# Patient Record
Sex: Female | Born: 2000 | Race: Black or African American | Hispanic: No | Marital: Single | Smoking: Never smoker
Health system: Southern US, Community
[De-identification: ages and names within clinical notes are randomized; demographics above are authoritative.]

## PROBLEM LIST (undated history)

## (undated) DIAGNOSIS — J45909 Unspecified asthma, uncomplicated: Secondary | ICD-10-CM

## (undated) DIAGNOSIS — B009 Herpesviral infection, unspecified: Secondary | ICD-10-CM

## (undated) DIAGNOSIS — K900 Celiac disease: Secondary | ICD-10-CM

## (undated) HISTORY — PX: LAPAROSCOPY: SHX197

---
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
## 2019-05-29 DIAGNOSIS — F419 Anxiety disorder, unspecified: Secondary | ICD-10-CM | POA: Insufficient documentation

## 2019-06-01 ENCOUNTER — Other Ambulatory Visit: Payer: Self-pay

## 2019-06-01 ENCOUNTER — Emergency Department (HOSPITAL_COMMUNITY): Payer: 59

## 2019-06-01 ENCOUNTER — Emergency Department (HOSPITAL_COMMUNITY)
Admission: EM | Admit: 2019-06-01 | Discharge: 2019-06-01 | Disposition: A | Discharge status: Home / Self Care | Payer: 59 | Attending: Emergency Medicine | Admitting: Emergency Medicine

## 2019-06-01 ENCOUNTER — Encounter (HOSPITAL_COMMUNITY): Payer: Self-pay

## 2019-06-01 DIAGNOSIS — J209 Acute bronchitis, unspecified: Secondary | ICD-10-CM | POA: Diagnosis not present

## 2019-06-01 DIAGNOSIS — J45909 Unspecified asthma, uncomplicated: Secondary | ICD-10-CM | POA: Diagnosis not present

## 2019-06-01 DIAGNOSIS — J069 Acute upper respiratory infection, unspecified: Secondary | ICD-10-CM | POA: Diagnosis not present

## 2019-06-01 DIAGNOSIS — R0602 Shortness of breath: Secondary | ICD-10-CM | POA: Diagnosis present

## 2019-06-01 DIAGNOSIS — B9789 Other viral agents as the cause of diseases classified elsewhere: Secondary | ICD-10-CM

## 2019-06-01 DIAGNOSIS — J208 Acute bronchitis due to other specified organisms: Secondary | ICD-10-CM

## 2019-06-01 DIAGNOSIS — U071 COVID-19: Secondary | ICD-10-CM | POA: Insufficient documentation

## 2019-06-01 HISTORY — DX: Unspecified asthma, uncomplicated: J45.909

## 2019-06-01 LAB — SARS CORONAVIRUS 2 (TAT 6-24 HRS): SARS Coronavirus 2: POSITIVE — AB

## 2019-06-01 NOTE — ED Triage Notes (Signed)
Patient complains of body aches, chills, SOB with asthma exacerbation since Tuesday. Reports roommates sick as well. Speaking complete sentences, alert and oriented, smoker

## 2019-06-02 ENCOUNTER — Telehealth: Payer: Self-pay | Admitting: *Deleted

## 2019-06-02 NOTE — Telephone Encounter (Signed)
Pt given results of COVID test; she states that she had an asthma attack last night; instructions given to pt: Marland Kitchen Instruct the patient to remain in self-quarantine until they meet the "Non-Test Criteria for Ending Self-Isolation". Non-Test Criteria for Ending Self-Isolation All persons with fever and respiratory symptoms should isolate themselves until ALL conditions listed below are met: - at least 10 days since symptoms onset - AND 3 consecutive days fever free without antipyretics (acetaminophen [Tylenol] or ibuprofen [Advil]) - AND improvement in respiratory symptoms . If the patient develops respiratory issues/distress, seek medical care in the Emergency Department, call 911, reports symptoms and report COVID-19 positive test. Patient Instructions . Instruct the patient to continue to utilize over-the-counter medications for fever (ibuprofen and/or Tylenol) and cough (cough medicine and/or sore throat lozenges). Wannetta Sender the patient to wear a mask around people and follow good infection prevention techniques. . Patient to should only leave home to seek medical care. . Instruct patient to send family for food, prescriptions or medicines; or use delivery service.  . If the patient must leave the home, they must wear a mask in public. . Instruct patient to limit contact with immediate family members or caregivers in the home, and use mask, social distancing, and handwashing to decrease risk to patients. o Please continue good preventive care measures, including frequent hand washing, avoid touching your face, cover coughs/sneezes with tissue or into elbow, stay out of crowds and keep a 6-foot distance from others.   . Instruct patient and family to clean hard surfaces touched by patient frequently with household cleaning products; pt informed that the  [facility] will be  notified; pt advised to notify student health [employer]; she verbalized understanding; unable to chart in result  note because no encounter created.

## 2019-06-02 NOTE — Telephone Encounter (Signed)
Robbi Garter at [facility] notified of results; she verbalized understanding; unable to chart in result note because no encounter created.

## 2019-06-02 NOTE — Telephone Encounter (Signed)
Notified by Vaughan Basta, [facility] agent that the provider was like more information regarding the pt's result; however call dropped before transfer to triage RN; attempted to contact provider, Dr Ardelle Lesches, at [phone]; left message on voice mail.

## 2019-06-02 NOTE — Telephone Encounter (Signed)
Spoke with Dr Juliane Lack; verified that the pt was seen at [facility] ED on 06/01/2019.

## 2019-06-04 NOTE — ED Provider Notes (Signed)
[place] EMERGENCY DEPARTMENT Provider Note   CSN: [id number] Arrival date & time: 06/01/19  8453     History   Chief Complaint Chief Complaint  Patient presents with  . asthma / SOB    HPI Dacy Enrico is a 18 y.o. female.     HPI   18 year old female with cough, chills, shortness of breath.  She reports several sick contacts.  No known code exposures.  Reports past history of asthma.  She felt like she has had some mild wheezing.  She not taken her temperature.  No unusual leg pain or swelling.  Past Medical History:  Diagnosis Date  . Asthma     There are no active problems to display for this patient.   History reviewed. No pertinent surgical history.   OB History   No obstetric history on file.      Home Medications    Prior to Admission medications   Not on File    Family History No family history on file.  Social History Social History   Tobacco Use  . Smoking status: Not on file  Substance Use Topics  . Alcohol use: Not on file  . Drug use: Not on file     Allergies   Patient has no known allergies.   Review of Systems Review of Systems  All systems reviewed and negative, other than as noted in HPI.  Physical Exam Updated Vital Signs BP 110/73   Pulse 69   Temp 98.4 F (36.9 C) (Oral)   Resp 16   Ht 5' 2"  (1.575 m)   Wt 66.2 kg   SpO2 99%   BMI 26.70 kg/m   Physical Exam Vitals signs and nursing note reviewed.  Constitutional:      General: She is not in acute distress.    Appearance: She is well-developed.  HENT:     Head: Normocephalic and atraumatic.  Eyes:     General:        Right eye: No discharge.        Left eye: No discharge.     Conjunctiva/sclera: Conjunctivae normal.  Neck:     Musculoskeletal: Neck supple.  Cardiovascular:     Rate and Rhythm: Normal rate and regular rhythm.     Heart sounds: Normal heart sounds. No murmur. No friction rub. No gallop.   Pulmonary:     Effort:  Pulmonary effort is normal. No respiratory distress.     Breath sounds: Normal breath sounds.  Abdominal:     General: There is no distension.     Palpations: Abdomen is soft.     Tenderness: There is no abdominal tenderness.  Musculoskeletal:        General: No tenderness.  Skin:    General: Skin is warm and dry.  Neurological:     Mental Status: She is alert.  Psychiatric:        Behavior: Behavior normal.        Thought Content: Thought content normal.      ED Treatments / Results  Labs (all labs ordered are listed, but only abnormal results are displayed) Labs Reviewed  SARS CORONAVIRUS 2 (TAT 6-24 HRS) - Abnormal; Notable for the following components:      Result Value   SARS Coronavirus 2 POSITIVE (*)    All other components within normal limits    EKG EKG Interpretation  Date/Time:  Sunday June 01 2019 09:55:46 EDT Ventricular Rate:  78 PR Interval:  144  QRS Duration: 86 QT Interval:  372 QTC Calculation: 424 R Axis:   85 Text Interpretation:  Normal sinus rhythm Early repolarization Normal ECG agree. no old comparison Confirmed by Charlesetta Shanks [phone]) on 06/01/2019 10:21:30 AM   Radiology No results found.  Procedures Procedures (including critical care time)  Medications Ordered in ED Medications - No data to display   Initial Impression / Assessment and Plan / ED Course  I have reviewed the triage vital signs and the nursing notes.  Pertinent labs & imaging results that were available during my care of the patient were reviewed by me and considered in my medical decision making (see chart for details).        18 year old female with likely viral respiratory illness.  Possibly COVID.  She overall looks very well though.  Oxygen saturations are percent on room air.  I feel she is appropriate for continued symptomatic treatment.  Return precautions were discussed.  Advised that she needs to continue to quarantine until results come back.   Of note, at time of completion of chart her COVID test had been resulted and is positive.   Ahnyla Mendel was evaluated in Emergency Department on 06/04/2019 for the symptoms described in the history of present illness. She was evaluated in the context of the global COVID-19 pandemic, which necessitated consideration that the patient might be at risk for infection with the SARS-CoV-2 virus that causes COVID-19. Institutional protocols and algorithms that pertain to the evaluation of patients at risk for COVID-19 are in a state of rapid change based on information released by regulatory bodies including the CDC and federal and state organizations. These policies and algorithms were followed during the patient's care in the ED.   Final Clinical Impressions(s) / ED Diagnoses   Final diagnoses:  Viral respiratory illness  Acute bronchitis due to COVID-19 virus    ED Discharge Orders    None       Virgel Manifold, MD 06/04/19 [phone]

## 2019-08-25 DIAGNOSIS — G43909 Migraine, unspecified, not intractable, without status migrainosus: Secondary | ICD-10-CM | POA: Insufficient documentation

## 2019-11-15 ENCOUNTER — Encounter (HOSPITAL_COMMUNITY): Payer: Self-pay | Admitting: Emergency Medicine

## 2019-11-15 ENCOUNTER — Ambulatory Visit (HOSPITAL_COMMUNITY)
Admission: EM | Admit: 2019-11-15 | Discharge: 2019-11-15 | Disposition: A | Discharge status: Home / Self Care | Payer: 59 | Attending: Urgent Care | Admitting: Urgent Care

## 2019-11-15 ENCOUNTER — Other Ambulatory Visit: Payer: Self-pay

## 2019-11-15 DIAGNOSIS — Z842 Family history of other diseases of the genitourinary system: Secondary | ICD-10-CM | POA: Diagnosis present

## 2019-11-15 DIAGNOSIS — Z3202 Encounter for pregnancy test, result negative: Secondary | ICD-10-CM | POA: Diagnosis not present

## 2019-11-15 DIAGNOSIS — N760 Acute vaginitis: Secondary | ICD-10-CM | POA: Diagnosis not present

## 2019-11-15 DIAGNOSIS — N941 Unspecified dyspareunia: Secondary | ICD-10-CM | POA: Insufficient documentation

## 2019-11-15 HISTORY — DX: Celiac disease: K90.0

## 2019-11-15 LAB — POC URINE PREG, ED: Preg Test, Ur: NEGATIVE

## 2019-11-15 LAB — POCT URINALYSIS DIP (DEVICE)
Bilirubin Urine: NEGATIVE
Glucose, UA: NEGATIVE mg/dL
Hgb urine dipstick: NEGATIVE
Ketones, ur: NEGATIVE mg/dL
Nitrite: NEGATIVE
Protein, ur: NEGATIVE mg/dL
Specific Gravity, Urine: 1.02 (ref 1.005–1.030)
Urobilinogen, UA: 0.2 mg/dL (ref 0.0–1.0)
pH: 7.5 (ref 5.0–8.0)

## 2019-11-15 LAB — POCT PREGNANCY, URINE: Preg Test, Ur: NEGATIVE

## 2019-11-15 MED ORDER — CLINDAMYCIN HCL 300 MG PO CAPS: 300.0000 mg | ORAL_CAPSULE | Freq: Two times a day (BID) | ORAL | 0 refills | Status: DC

## 2019-11-15 MED ORDER — FLUCONAZOLE 150 MG PO TABS: 150.0000 mg | ORAL_TABLET | ORAL | 0 refills | Status: DC

## 2019-11-15 NOTE — ED Triage Notes (Signed)
Lower abdominal pain, particularly after sex, patient also reports occasional pain with urination.  Pain for  1-2 weeks.  Reports having vaginal discharge

## 2019-11-15 NOTE — ED Provider Notes (Signed)
[facility]   MRN: [id number] DOB: March 25, 2001  Subjective:   Victoria Hoffman is a 19 y.o. female presenting for 1-2 week hx of dyspareunia, one episode of dysuria, malodorous clumpy vaginal discharge. Has 1 female partner, reports its monogamous relationship, low concern for STI, does not use condoms for protection.  Has a hx of BV and yeast infections.  Family hx of endometriosis.   No current facility-administered medications for this encounter.  Current Outpatient Medications:  .  fexofenadine (ALLEGRA) 60 MG tablet, Take 60 mg by mouth 2 (two) times daily., Disp: , Rfl:  .  IBUPROFEN IB PO, Take by mouth., Disp: , Rfl:  .  norethindrone-ethinyl estradiol (LOESTRIN FE) 1-20 MG-MCG tablet, Take 1 tablet by mouth daily., Disp: , Rfl:    No Known Allergies  Past Medical History:  Diagnosis Date  . Asthma   . Celiac disease      History reviewed. No pertinent surgical history.  Family History  Problem Relation Age of Onset  . Endometriosis Mother   . Hyperlipidemia Mother   . Hypertension Mother   . Celiac disease Mother     Social History   Tobacco Use  . Smoking status: Never Smoker  Substance Use Topics  . Alcohol use: Yes  . Drug use: Yes    Review of Systems  Constitutional: Negative for chills and fever.  Respiratory: Negative for shortness of breath.   Cardiovascular: Negative for chest pain.  Gastrointestinal: Positive for abdominal pain (pelvic). Negative for nausea and vomiting.  Genitourinary: Negative for dysuria, flank pain, frequency, hematuria and urgency.  Musculoskeletal: Negative for myalgias.  Skin: Negative for rash.  Neurological: Negative for dizziness and headaches.     Objective:   Vitals: BP 122/77 (BP Location: Left Arm)   Pulse 72   Temp 98.5 F (36.9 C) (Oral)   Resp 18   LMP 11/08/2019   SpO2 98%   Physical Exam Constitutional:      General: She is not in acute distress.    Appearance: Normal appearance. She is  well-developed. She is not ill-appearing, toxic-appearing or diaphoretic.  HENT:     Head: Normocephalic and atraumatic.     Nose: Nose normal.     Mouth/Throat:     Mouth: Mucous membranes are moist.     Pharynx: Oropharynx is clear.  Eyes:     General: No scleral icterus.    Extraocular Movements: Extraocular movements intact.     Pupils: Pupils are equal, round, and reactive to light.  Cardiovascular:     Rate and Rhythm: Normal rate and regular rhythm.     Pulses: Normal pulses.     Heart sounds: Normal heart sounds. No murmur. No friction rub. No gallop.   Pulmonary:     Effort: Pulmonary effort is normal. No respiratory distress.     Breath sounds: Normal breath sounds. No stridor. No wheezing, rhonchi or rales.  Abdominal:     General: Bowel sounds are normal. There is no distension.     Palpations: Abdomen is soft. There is no mass.     Tenderness: There is generalized abdominal tenderness and tenderness in the periumbilical area and suprapubic area. There is no right CVA tenderness, left CVA tenderness, guarding or rebound.  Skin:    General: Skin is warm and dry.     Findings: No rash.  Neurological:     General: No focal deficit present.     Mental Status: She is alert and oriented to person, place,  and time.  Psychiatric:        Mood and Affect: Mood normal.        Behavior: Behavior normal.        Thought Content: Thought content normal.        Judgment: Judgment normal.     Results for orders placed or performed during the hospital encounter of 11/15/19 (from the past 24 hour(s))  POCT urinalysis dip (device)     Status: Abnormal   Collection Time: 11/15/19  5:57 PM  Result Value Ref Range   Glucose, UA NEGATIVE NEGATIVE mg/dL   Bilirubin Urine NEGATIVE NEGATIVE   Ketones, ur NEGATIVE NEGATIVE mg/dL   Specific Gravity, Urine 1.020 1.005 - 1.030   Hgb urine dipstick NEGATIVE NEGATIVE   pH 7.5 5.0 - 8.0   Protein, ur NEGATIVE NEGATIVE mg/dL   Urobilinogen,  UA 0.2 0.0 - 1.0 mg/dL   Nitrite NEGATIVE NEGATIVE   Leukocytes,Ua TRACE (A) NEGATIVE  POC urine pregnancy     Status: None   Collection Time: 11/15/19  6:02 PM  Result Value Ref Range   Preg Test, Ur NEGATIVE NEGATIVE  Pregnancy, urine POC     Status: None   Collection Time: 11/15/19  6:02 PM  Result Value Ref Range   Preg Test, Ur NEGATIVE NEGATIVE    Assessment and Plan :   1. Acute vaginitis   2. Dyspareunia in female   3. Family history of endometriosis     Counseled patient and advised patient she has endometriosis and needs further evaluation with gynecologist.  Did provide her with information to establish care.  She is to start Diflucan and clindamycin to cover for yeast vaginitis and BV.  Patient feels assured about her monogamous relationship, discussed possibility of PID but patient prefers to wait on results to treat this. Counseled patient on potential for adverse effects with medications prescribed/recommended today, ER and return-to-clinic precautions discussed, patient verbalized understanding.    Jaynee Eagles, [place] 11/15/19 1806

## 2019-11-19 ENCOUNTER — Other Ambulatory Visit: Payer: Self-pay | Admitting: Nurse Practitioner

## 2019-11-19 DIAGNOSIS — R102 Pelvic and perineal pain: Secondary | ICD-10-CM

## 2019-11-20 ENCOUNTER — Ambulatory Visit
Admission: RE | Admit: 2019-11-20 | Discharge: 2019-11-20 | Disposition: A | Discharge status: Home / Self Care | Payer: 59 | Source: Ambulatory Visit | Attending: Nurse Practitioner | Admitting: Nurse Practitioner

## 2019-11-20 ENCOUNTER — Other Ambulatory Visit: Payer: Self-pay

## 2019-11-20 ENCOUNTER — Ambulatory Visit (HOSPITAL_COMMUNITY)
Admission: EM | Admit: 2019-11-20 | Discharge: 2019-11-20 | Disposition: A | Discharge status: Home / Self Care | Payer: 59 | Attending: Urgent Care | Admitting: Urgent Care

## 2019-11-20 ENCOUNTER — Telehealth (HOSPITAL_COMMUNITY): Payer: Self-pay | Admitting: *Deleted

## 2019-11-20 DIAGNOSIS — R350 Frequency of micturition: Secondary | ICD-10-CM | POA: Insufficient documentation

## 2019-11-20 DIAGNOSIS — Z7251 High risk heterosexual behavior: Secondary | ICD-10-CM | POA: Diagnosis not present

## 2019-11-20 DIAGNOSIS — Z113 Encounter for screening for infections with a predominantly sexual mode of transmission: Secondary | ICD-10-CM

## 2019-11-20 DIAGNOSIS — R899 Unspecified abnormal finding in specimens from other organs, systems and tissues: Secondary | ICD-10-CM | POA: Insufficient documentation

## 2019-11-20 DIAGNOSIS — R102 Pelvic and perineal pain: Secondary | ICD-10-CM

## 2019-11-20 LAB — POCT URINALYSIS DIP (DEVICE)
Bilirubin Urine: NEGATIVE
Glucose, UA: NEGATIVE mg/dL
Hgb urine dipstick: NEGATIVE
Ketones, ur: NEGATIVE mg/dL
Leukocytes,Ua: NEGATIVE
Nitrite: NEGATIVE
Protein, ur: NEGATIVE mg/dL
Specific Gravity, Urine: 1.02 (ref 1.005–1.030)
Urobilinogen, UA: 0.2 mg/dL (ref 0.0–1.0)
pH: 7.5 (ref 5.0–8.0)

## 2019-11-20 LAB — CERVICOVAGINAL ANCILLARY ONLY
Bacterial vaginitis: NEGATIVE
Candida vaginitis: NEGATIVE
Chlamydia: NEGATIVE
Neisseria Gonorrhea: UNDETERMINED
Trichomonas: NEGATIVE

## 2019-11-20 MED ORDER — CEFTRIAXONE SODIUM 500 MG IJ SOLR
INTRAMUSCULAR | Status: AC
  Filled 2019-11-20: qty 500

## 2019-11-20 MED ORDER — CEFTRIAXONE SODIUM 500 MG IJ SOLR: 500.0000 mg | Freq: Once | INTRAMUSCULAR | Status: DC

## 2019-11-20 MED ORDER — CEFTRIAXONE SODIUM 500 MG IJ SOLR
500.0000 mg | Freq: Once | INTRAMUSCULAR | Status: AC
  Administered 2019-11-20: 500 mg via INTRAMUSCULAR

## 2019-11-20 MED ORDER — LIDOCAINE HCL (PF) 1 % IJ SOLN
INTRAMUSCULAR | Status: AC
  Filled 2019-11-20: qty 2

## 2019-11-20 NOTE — ED Triage Notes (Signed)
Pt request retesting for STI prior to receiving Rocephin tx. Also requests a "pelvic exam". States she had pelvic pain yesterday and was evaluated by gyn yesterday including a pelvic exam. Reports taking AZO yesterday and her back pain, painful urination resolved. Had ultrasound this morning at [facility] imaging.

## 2019-11-20 NOTE — ED Provider Notes (Signed)
[facility]   MRN: [id number] DOB: 12/28/2000  Subjective:   Victoria Hoffman is a 19 y.o. female presenting for recheck on STIs.  Patient was last seen on 11/15/2019.  All results were negative but gonorrhea test was indeterminate.  I discussed this with patient by phone.  She presents now wanting to get rechecked but also would like to have urinalysis done again and recheck for her STIs.  She states that she does not have trouble right now with dysuria, vaginal discharge, fever, nausea, vomiting, abdominal pain.  She is very concerned because her boyfriend states that he has been monogamous but is very stressed out about having this indeterminate test.  Ultimately, patient wants to make sure that she gets treated.  Denies taking chronic medications.   No Known Allergies  Past Medical History:  Diagnosis Date  . Asthma   . Celiac disease      No past surgical history on file.  Family History  Problem Relation Age of Onset  . Endometriosis Mother   . Hyperlipidemia Mother   . Hypertension Mother   . Celiac disease Mother     Social History   Tobacco Use  . Smoking status: Never Smoker  Substance Use Topics  . Alcohol use: Yes  . Drug use: Yes    ROS   Objective:   Vitals: BP 123/78 (BP Location: Right Arm)   Pulse 79   Temp 98.7 F (37.1 C) (Oral)   Resp 20   LMP 11/08/2019   SpO2 97%   Physical Exam Constitutional:      General: She is not in acute distress.    Appearance: Normal appearance. She is well-developed and normal weight. She is not ill-appearing, toxic-appearing or diaphoretic.  HENT:     Head: Normocephalic and atraumatic.     Right Ear: External ear normal.     Left Ear: External ear normal.     Nose: Nose normal.     Mouth/Throat:     Mouth: Mucous membranes are moist.     Pharynx: Oropharynx is clear.  Eyes:     General: No scleral icterus.    Extraocular Movements: Extraocular movements intact.     Pupils: Pupils are equal,  round, and reactive to light.  Cardiovascular:     Rate and Rhythm: Normal rate and regular rhythm.     Heart sounds: Normal heart sounds. No murmur. No friction rub. No gallop.   Pulmonary:     Effort: Pulmonary effort is normal. No respiratory distress.     Breath sounds: Normal breath sounds. No stridor. No wheezing, rhonchi or rales.  Abdominal:     General: Bowel sounds are normal. There is no distension.     Palpations: Abdomen is soft. There is no mass.     Tenderness: There is no abdominal tenderness. There is no right CVA tenderness, left CVA tenderness, guarding or rebound.  Skin:    General: Skin is warm and dry.     Coloration: Skin is not pale.     Findings: No rash.  Neurological:     General: No focal deficit present.     Mental Status: She is alert and oriented to person, place, and time.  Psychiatric:        Mood and Affect: Mood normal.        Behavior: Behavior normal.        Thought Content: Thought content normal.        Judgment: Judgment normal.  Results for orders placed or performed during the hospital encounter of 11/20/19 (from the past 24 hour(s))  POCT urinalysis dip (device)     Status: None   Collection Time: 11/20/19  3:08 PM  Result Value Ref Range   Glucose, UA NEGATIVE NEGATIVE mg/dL   Bilirubin Urine NEGATIVE NEGATIVE   Ketones, ur NEGATIVE NEGATIVE mg/dL   Specific Gravity, Urine 1.020 1.005 - 1.030   Hgb urine dipstick NEGATIVE NEGATIVE   pH 7.5 5.0 - 8.0   Protein, ur NEGATIVE NEGATIVE mg/dL   Urobilinogen, UA 0.2 0.0 - 1.0 mg/dL   Nitrite NEGATIVE NEGATIVE   Leukocytes,Ua NEGATIVE NEGATIVE    Assessment and Plan :   1. Abnormal laboratory test result   2. Unprotected sex   3. Urinary frequency     Had extensive discussion with patient about her indeterminate gonorrhea result.  Ultimately, patient decided on treating empirically with IM ceftriaxone in clinic.  I reassured her that the urinalysis was normal and she does not  have urinary tract infection.  Patient requested to repeat testing for her cervical swab.  I informed her that we did let her know with these results are through my chart. Counseled patient on potential for adverse effects with medications prescribed/recommended today, ER and return-to-clinic precautions discussed, patient verbalized understanding.    Jaynee Eagles, [place] 11/20/19 1858

## 2019-11-20 NOTE — Discharge Instructions (Signed)
Avoid all forms of sexual intercourse (oral, vaginal, anal) for the next 7 days to avoid spreading/reinfecting. Return if symptoms worsen/do not resolve, you develop fever, abdominal pain, blood in your urine, or are re-exposed to an STI.

## 2019-11-20 NOTE — Telephone Encounter (Signed)
Jaynee Eagles PA contacted patient directly. She has an indeterminant gonorrhea result, all other tests were negative. Emphasized that we should go ahead and treat for gonorrhea with 547m IM ceftriaxone x 1 dose. Patient is agreeable to this and will come in for nurse visit.

## 2019-11-21 LAB — URINE CULTURE: Culture: NO GROWTH

## 2019-11-22 LAB — CERVICOVAGINAL ANCILLARY ONLY
Bacterial vaginitis: NEGATIVE
Candida vaginitis: NEGATIVE
Chlamydia: NEGATIVE
Neisseria Gonorrhea: NEGATIVE
Trichomonas: NEGATIVE

## 2020-08-19 ENCOUNTER — Encounter (HOSPITAL_COMMUNITY): Payer: Self-pay

## 2020-08-19 ENCOUNTER — Ambulatory Visit (HOSPITAL_COMMUNITY)
Admission: EM | Admit: 2020-08-19 | Discharge: 2020-08-19 | Disposition: A | Discharge status: Home / Self Care | Payer: 59 | Attending: Family Medicine | Admitting: Family Medicine

## 2020-08-19 ENCOUNTER — Other Ambulatory Visit: Payer: Self-pay

## 2020-08-19 DIAGNOSIS — Z202 Contact with and (suspected) exposure to infections with a predominantly sexual mode of transmission: Secondary | ICD-10-CM | POA: Diagnosis not present

## 2020-08-19 NOTE — Discharge Instructions (Signed)
Please follow up if you notice any symptoms.

## 2020-08-19 NOTE — ED Triage Notes (Signed)
Pt reports of exposure to a partner with an STD. Pt states she is not having sxs at the moment. Pt states she was exposed last week. Pt is requesting to have tx. Pt states she wants a vaginal check to see if there are any bumps in the area.

## 2020-08-19 NOTE — ED Provider Notes (Signed)
[facility]    CSN: [id number] Arrival date & time: 08/19/20  1847      History   Chief Complaint Chief Complaint  Patient presents with   Exposure to STD    HPI Victoria Hoffman is a 19 y.o. female.   She is presenting with concern for being exposed to herpes.  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  She denies any outbreaks or any vesicles that she is seen.  She reports being tested negative for gonorrhea chlamydia 2 weeks ago.  Denies any vaginal discharge or dysuria.  HPI  Past Medical History:  Diagnosis Date   Asthma    Celiac disease     There are no problems to display for this patient.   History reviewed. No pertinent surgical history.  OB History   No obstetric history on file.      Home Medications    Prior to Admission medications   Medication Sig Start Date End Date Taking? Authorizing Provider  clindamycin (CLEOCIN) 300 MG capsule Take 1 capsule (300 mg total) by mouth in the morning and at bedtime. 11/15/19   Wallis Bamberg, PA-C  famotidine (PEPCID) 10 MG tablet Take by mouth.    [provider]  fexofenadine (ALLEGRA) 60 MG tablet Take 60 mg by mouth 2 (two) times daily.    [provider]  fluconazole (DIFLUCAN) 150 MG tablet Take 1 tablet (150 mg total) by mouth once a week. 11/15/19   Wallis Bamberg, PA-C  IBUPROFEN IB PO Take by mouth.    [provider]  norethindrone-ethinyl estradiol (LOESTRIN FE) 1-20 MG-MCG tablet Take 1 tablet by mouth daily.    [provider]    Family History Family History  Problem Relation Age of Onset   Endometriosis Mother    Hyperlipidemia Mother    Hypertension Mother    Celiac disease Mother     Social History Social History   Tobacco Use   Smoking status: Never Smoker   Smokeless tobacco: Never Used  Substance Use Topics   Alcohol use: Yes   Drug use: Yes     Allergies   Patient has no known allergies.   Review  of Systems Review of Systems  See HPI  Physical Exam Triage Vital Signs ED Triage Vitals  Enc Vitals Group     BP 08/19/20 1950 130/79     Pulse Rate 08/19/20 1950 89     Resp 08/19/20 1950 18     Temp 08/19/20 1950 99 F (37.2 C)     Temp Source 08/19/20 1950 Oral     SpO2 08/19/20 1950 100 %     Weight --      Height --      Head Circumference --      Peak Flow --      Pain Score 08/19/20 1947 0     Pain Loc --      Pain Edu? --      Excl. in GC? --    No data found.  Updated Vital Signs BP 130/79 (BP Location: Right Arm)    Pulse 89    Temp 99 F (37.2 C) (Oral)    Resp 18    LMP 08/15/2020 (Exact Date)    SpO2 100%   Visual Acuity Right Eye Distance:   Left Eye Distance:   Bilateral Distance:    Right Eye Near:   Left Eye Near:    Bilateral Near:  Physical Exam Gen: NAD, alert, cooperative with exam, well-appearing ENT: normal lips, normal nasal mucosa,  Eye: normal EOM, normal conjunctiva and lids GU: Normal-appearing external vaginal genitalia. Skin: no rashes, no areas of induration  Neuro: normal tone, normal sensation to touch Psych:  normal insight, alert and oriented    UC Treatments / Results  Labs (all labs ordered are listed, but only abnormal results are displayed) Labs Reviewed - No data to display  EKG   Radiology No results found.  Procedures Procedures (including critical care time)  Medications Ordered in UC Medications - No data to display  Initial Impression / Assessment and Plan / UC Course  I have reviewed the triage vital signs and the nursing notes.  Pertinent labs & imaging results that were available during my care of the patient were reviewed by me and considered in my medical decision making (see chart for details).     Victoria Hoffman is a 19 year old female that is presenting with concern for exposure to HSV-2.  No clinical suggestion on exam.  Counseled supportive care.  Given occasional follow-up.  Final Clinical  Impressions(s) / UC Diagnoses   Final diagnoses:  Possible exposure to STD     Discharge Instructions     Please follow up if you notice any symptoms.      ED Prescriptions    None     PDMP not reviewed this encounter.   Myra Rude, MD 08/19/20 2137

## 2021-05-10 ENCOUNTER — Emergency Department (HOSPITAL_COMMUNITY)
Admission: EM | Admit: 2021-05-10 | Discharge: 2021-05-11 | Disposition: A | Discharge status: Home / Self Care | Payer: BC Managed Care – PPO | Attending: Emergency Medicine | Admitting: Emergency Medicine

## 2021-05-10 ENCOUNTER — Other Ambulatory Visit: Payer: Self-pay

## 2021-05-10 DIAGNOSIS — Z20822 Contact with and (suspected) exposure to covid-19: Secondary | ICD-10-CM | POA: Insufficient documentation

## 2021-05-10 DIAGNOSIS — J3489 Other specified disorders of nose and nasal sinuses: Secondary | ICD-10-CM | POA: Diagnosis not present

## 2021-05-10 DIAGNOSIS — J45909 Unspecified asthma, uncomplicated: Secondary | ICD-10-CM | POA: Insufficient documentation

## 2021-05-10 DIAGNOSIS — J00 Acute nasopharyngitis [common cold]: Secondary | ICD-10-CM | POA: Diagnosis not present

## 2021-05-10 DIAGNOSIS — R531 Weakness: Secondary | ICD-10-CM | POA: Diagnosis not present

## 2021-05-10 DIAGNOSIS — R509 Fever, unspecified: Secondary | ICD-10-CM | POA: Diagnosis present

## 2021-05-10 LAB — COMPREHENSIVE METABOLIC PANEL
ALT: 16 U/L (ref 0–44)
AST: 22 U/L (ref 15–41)
Albumin: 4.2 g/dL (ref 3.5–5.0)
Alkaline Phosphatase: 43 U/L (ref 38–126)
Anion gap: 8 (ref 5–15)
BUN: 9 mg/dL (ref 6–20)
CO2: 24 mmol/L (ref 22–32)
Calcium: 8.8 mg/dL — ABNORMAL LOW (ref 8.9–10.3)
Chloride: 104 mmol/L (ref 98–111)
Creatinine, Ser: 0.84 mg/dL (ref 0.44–1.00)
GFR, Estimated: >60 mL/min (ref >60)
Glucose, Bld: 98 mg/dL (ref 70–99)
Potassium: 4 mmol/L (ref 3.5–5.1)
Sodium: 136 mmol/L (ref 135–145)
Total Bilirubin: 0.7 mg/dL (ref 0.3–1.2)
Total Protein: 7.8 g/dL (ref 6.5–8.1)

## 2021-05-10 LAB — CBC WITH DIFFERENTIAL/PLATELET
Abs Immature Granulocytes: 0.01 10*3/uL (ref 0.00–0.07)
Basophils Absolute: 0 10*3/uL (ref 0.0–0.1)
Basophils Relative: 1 %
Eosinophils Absolute: 0 10*3/uL (ref 0.0–0.5)
Eosinophils Relative: 0 %
HCT: 39.3 % (ref 36.0–46.0)
Hemoglobin: 12.8 g/dL (ref 12.0–15.0)
Immature Granulocytes: 0 %
Lymphocytes Relative: 28 %
Lymphs Abs: 2 10*3/uL (ref 0.7–4.0)
MCH: 28.7 pg (ref 26.0–34.0)
MCHC: 32.6 g/dL (ref 30.0–36.0)
MCV: 88.1 fL (ref 80.0–100.0)
Monocytes Absolute: 0.6 10*3/uL (ref 0.1–1.0)
Monocytes Relative: 8 %
Neutro Abs: 4.5 10*3/uL (ref 1.7–7.7)
Neutrophils Relative %: 63 %
Platelets: 307 10*3/uL (ref 150–400)
RBC: 4.46 MIL/uL (ref 3.87–5.11)
RDW: 13.2 % (ref 11.5–15.5)
WBC: 7.2 10*3/uL (ref 4.0–10.5)
nRBC: 0 % (ref 0.0–0.2)

## 2021-05-10 LAB — I-STAT BETA HCG BLOOD, ED (MC, WL, AP ONLY): I-stat hCG, quantitative: <5 m[IU]/mL (ref <5)

## 2021-05-10 LAB — RESP PANEL BY RT-PCR (FLU A&B, COVID) ARPGX2
Influenza A by PCR: NEGATIVE
Influenza B by PCR: NEGATIVE
SARS Coronavirus 2 by RT PCR: NEGATIVE

## 2021-05-10 LAB — LACTIC ACID, PLASMA: Lactic Acid, Venous: 0.9 mmol/L (ref 0.5–1.9)

## 2021-05-10 NOTE — ED Triage Notes (Signed)
Pt complains of fever and weakness since Friday.

## 2021-05-10 NOTE — ED Notes (Signed)
2nd lactic acid have not been collected

## 2021-05-11 NOTE — ED Provider Notes (Signed)
[facility] DEPT Provider Note  CSN: [id number] Arrival date & time: 05/10/21 2250  Chief Complaint(s) Fever and Weakness  HPI Victoria Hoffman is a 20 y.o. female    Fever Max temp prior to arrival:  25 Temp source:  Oral Severity:  Moderate Onset quality:  Gradual Duration:  5 days Timing:  Intermittent Progression:  Waxing and waning Chronicity:  New Relieved by:  Acetaminophen Associated symptoms: chills, congestion, headaches (sinus) and rhinorrhea   Associated symptoms: no cough, no dysuria, no myalgias, no nausea, no sore throat and no vomiting   Risk factors: sick contacts   Weakness Associated symptoms: fever and headaches (sinus)   Associated symptoms: no cough, no dysuria, no myalgias, no nausea and no vomiting    Past Medical History Past Medical History:  Diagnosis Date   Asthma    Celiac disease    There are no problems to display for this patient.  Home Medication(s) Prior to Admission medications   Medication Sig Start Date End Date Taking? Authorizing Provider  clindamycin (CLEOCIN) 300 MG capsule Take 1 capsule (300 mg total) by mouth in the morning and at bedtime. 11/15/19   Jaynee Eagles, PA-C  famotidine (PEPCID) 10 MG tablet Take by mouth.    [provider]  fexofenadine (ALLEGRA) 60 MG tablet Take 60 mg by mouth 2 (two) times daily.    [provider]  fluconazole (DIFLUCAN) 150 MG tablet Take 1 tablet (150 mg total) by mouth once a week. 11/15/19   Jaynee Eagles, PA-C  IBUPROFEN IB PO Take by mouth.    [provider]  norethindrone-ethinyl estradiol (LOESTRIN FE) 1-20 MG-MCG tablet Take 1 tablet by mouth daily.    [provider]                                                                                                                                    Past Surgical History No past surgical history on file. Family History Family History  Problem Relation Age of Onset    Endometriosis Mother    Hyperlipidemia Mother    Hypertension Mother    Celiac disease Mother     Social History Social History   Tobacco Use   Smoking status: Never   Smokeless tobacco: Never  Substance Use Topics   Alcohol use: Yes   Drug use: Yes   Allergies Patient has no known allergies.  Review of Systems Review of Systems  Constitutional:  Positive for chills and fever.  HENT:  Positive for congestion and rhinorrhea. Negative for sore throat.   Respiratory:  Negative for cough.   Gastrointestinal:  Negative for nausea and vomiting.  Genitourinary:  Negative for dysuria.  Musculoskeletal:  Negative for myalgias.  Neurological:  Positive for weakness and headaches (sinus).  All other systems are reviewed and are negative for acute change except as noted in the HPI  Physical Exam Vital Signs  I  have reviewed the triage vital signs BP 114/67   Pulse 93   Temp 99.8 F (37.7 C) (Oral)   Resp 18   Ht 5' 2"  (1.575 m)   Wt 68 kg   SpO2 100%   BMI 27.44 kg/m    Physical Exam Vitals reviewed.  Constitutional:      General: She is not in acute distress.    Appearance: She is well-developed. She is not diaphoretic.  HENT:     Head: Normocephalic and atraumatic.     Right Ear: Tympanic membrane normal.     Left Ear: Tympanic membrane normal.     Nose: Congestion and rhinorrhea present.     Right Sinus: Frontal sinus tenderness present.     Left Sinus: Frontal sinus tenderness present.     Mouth/Throat:     Comments: Post nasal drip with cobblestoning Eyes:     General: No scleral icterus.       Right eye: No discharge.        Left eye: No discharge.     Conjunctiva/sclera: Conjunctivae normal.     Pupils: Pupils are equal, round, and reactive to light.  Cardiovascular:     Rate and Rhythm: Normal rate and regular rhythm.     Heart sounds: No murmur heard.   No friction rub. No gallop.  Pulmonary:     Effort: Pulmonary effort is normal. No respiratory  distress.     Breath sounds: Normal breath sounds. No stridor. No rales.  Abdominal:     General: There is no distension.     Palpations: Abdomen is soft.     Tenderness: There is no abdominal tenderness.  Musculoskeletal:        General: No tenderness.     Cervical back: Normal range of motion and neck supple.  Skin:    General: Skin is warm and dry.     Findings: No erythema or rash.  Neurological:     Mental Status: She is alert and oriented to person, place, and time.    ED Results and Treatments Labs (all labs ordered are listed, but only abnormal results are displayed) Labs Reviewed  COMPREHENSIVE METABOLIC PANEL - Abnormal; Notable for the following components:      Result Value   Calcium 8.8 (*)    All other components within normal limits  RESP PANEL BY RT-PCR (FLU A&B, COVID) ARPGX2  CBC WITH DIFFERENTIAL/PLATELET  LACTIC ACID, PLASMA  LACTIC ACID, PLASMA  I-STAT BETA HCG BLOOD, ED ([facility], [facility], [facility] ONLY)                                                                                                                         EKG  EKG Interpretation  Date/Time:    Ventricular Rate:    PR Interval:    QRS Duration:   QT Interval:    QTC Calculation:   R Axis:     Text Interpretation:  Radiology No results found.  Pertinent labs & imaging results that were available during my care of the patient were reviewed by me and considered in my medical decision making (see MDM for details).  Medications Ordered in ED Medications - No data to display                                                                                                                                   Procedures Procedures  (including critical care time)  Medical Decision Making / ED Course I have reviewed the nursing notes for this encounter and the patient's prior records (if available in EHR or on provided paperwork).  Victoria Hoffman was evaluated in Emergency Department on 05/11/2021  for the symptoms described in the history of present illness. She was evaluated in the context of the global COVID-19 pandemic, which necessitated consideration that the patient might be at risk for infection with the SARS-CoV-2 virus that causes COVID-19. Institutional protocols and algorithms that pertain to the evaluation of patients at risk for COVID-19 are in a state of rapid change based on information released by regulatory bodies including the CDC and federal and state organizations. These policies and algorithms were followed during the patient's care in the ED.     Patient presents with viral symptoms for 5 days. Adequate oral hydration. Rest of history as above.  Patient appears well. No signs of toxicity, patient is interactive. No hypoxia, tachypnea or other signs of respiratory distress. No sign of clinical dehydration. Lung exam clear. Rest of exam as above.  Screening labs from triage reassuring.  Most consistent with viral illness   No evidence suggestive of pharyngitis, AOM, PNA, or meningitis.  Chest x-ray not indicated at this time.  Discussed symptomatic treatment with the patient and they will follow closely with their PCP.   Pertinent labs & imaging results that were available during my care of the patient were reviewed by me and considered in my medical decision making:    Final Clinical Impression(s) / ED Diagnoses Final diagnoses:  Acute nasopharyngitis   The patient appears reasonably screened and/or stabilized for discharge and I doubt any other medical condition or other [facility] requiring further screening, evaluation, or treatment in the ED at this time prior to discharge. Safe for discharge with strict return precautions.  Disposition: Discharge  Condition: Good  I have discussed the results, Dx and Tx plan with the patient/family who expressed understanding and agree(s) with the plan. Discharge instructions discussed at length. The patient/family was given  strict return precautions who verbalized understanding of the instructions. No further questions at time of discharge.    ED Discharge Orders     None       Follow Up: Primary care provider  Call  as needed    This chart was dictated using voice recognition software.  Despite best efforts to proofread,  errors can occur which can change the  documentation meaning.    Fatima Blank, MD 05/11/21 [phone]

## 2021-05-11 NOTE — Discharge Instructions (Addendum)
You may take over-the-counter medicine for symptomatic relief, such as Tylenol, Motrin, TheraFlu, Alka seltzer , black elderberry, etc. Please limit acetaminophen (Tylenol) to 4000 mg and Ibuprofen (Motrin, Advil, etc.) to 2400 mg for a 24hr period. Please note that other over-the-counter medicine may contain acetaminophen or ibuprofen as a component of their ingredients.   

## 2021-07-31 ENCOUNTER — Emergency Department (HOSPITAL_COMMUNITY): Payer: BC Managed Care – PPO

## 2021-07-31 ENCOUNTER — Encounter (HOSPITAL_COMMUNITY): Payer: Self-pay | Admitting: Pharmacy Technician

## 2021-07-31 ENCOUNTER — Other Ambulatory Visit: Payer: Self-pay

## 2021-07-31 ENCOUNTER — Emergency Department (HOSPITAL_COMMUNITY)
Admission: EM | Admit: 2021-07-31 | Discharge: 2021-07-31 | Disposition: A | Discharge status: Home / Self Care | Payer: BC Managed Care – PPO | Attending: Emergency Medicine | Admitting: Emergency Medicine

## 2021-07-31 DIAGNOSIS — N9489 Other specified conditions associated with female genital organs and menstrual cycle: Secondary | ICD-10-CM | POA: Insufficient documentation

## 2021-07-31 DIAGNOSIS — R002 Palpitations: Secondary | ICD-10-CM | POA: Insufficient documentation

## 2021-07-31 DIAGNOSIS — J45909 Unspecified asthma, uncomplicated: Secondary | ICD-10-CM | POA: Diagnosis not present

## 2021-07-31 LAB — CBC WITH DIFFERENTIAL/PLATELET
Abs Immature Granulocytes: 0.01 10*3/uL (ref 0.00–0.07)
Basophils Absolute: 0.1 10*3/uL (ref 0.0–0.1)
Basophils Relative: 1 %
Eosinophils Absolute: 0.1 10*3/uL (ref 0.0–0.5)
Eosinophils Relative: 1 %
HCT: 40.8 % (ref 36.0–46.0)
Hemoglobin: 13.5 g/dL (ref 12.0–15.0)
Immature Granulocytes: 0 %
Lymphocytes Relative: 30 %
Lymphs Abs: 2 10*3/uL (ref 0.7–4.0)
MCH: 29.2 pg (ref 26.0–34.0)
MCHC: 33.1 g/dL (ref 30.0–36.0)
MCV: 88.1 fL (ref 80.0–100.0)
Monocytes Absolute: 0.6 10*3/uL (ref 0.1–1.0)
Monocytes Relative: 10 %
Neutro Abs: 3.9 10*3/uL (ref 1.7–7.7)
Neutrophils Relative %: 58 %
Platelets: 306 10*3/uL (ref 150–400)
RBC: 4.63 MIL/uL (ref 3.87–5.11)
RDW: 14.1 % (ref 11.5–15.5)
WBC: 6.7 10*3/uL (ref 4.0–10.5)
nRBC: 0 % (ref 0.0–0.2)

## 2021-07-31 LAB — BASIC METABOLIC PANEL
Anion gap: 8 (ref 5–15)
BUN: 7 mg/dL (ref 6–20)
CO2: 23 mmol/L (ref 22–32)
Calcium: 9 mg/dL (ref 8.9–10.3)
Chloride: 104 mmol/L (ref 98–111)
Creatinine, Ser: 0.78 mg/dL (ref 0.44–1.00)
GFR, Estimated: >60 mL/min (ref >60)
Glucose, Bld: 90 mg/dL (ref 70–99)
Potassium: 3.8 mmol/L (ref 3.5–5.1)
Sodium: 135 mmol/L (ref 135–145)

## 2021-07-31 LAB — HIV ANTIBODY (ROUTINE TESTING W REFLEX): HIV Screen 4th Generation wRfx: NONREACTIVE

## 2021-07-31 LAB — I-STAT BETA HCG BLOOD, ED (MC, WL, AP ONLY): I-stat hCG, quantitative: <5 m[IU]/mL (ref <5)

## 2021-07-31 MED ORDER — LORAZEPAM 1 MG PO TABS
1.0000 mg | ORAL_TABLET | Freq: Once | ORAL | Status: AC
  Administered 2021-07-31: 22:00:00 1 mg via ORAL
  Filled 2021-07-31: qty 1

## 2021-07-31 MED ORDER — LORAZEPAM 1 MG PO TABS: 1.0000 mg | ORAL_TABLET | Freq: Every day | ORAL | 0 refills | Status: DC | PRN

## 2021-07-31 NOTE — ED Provider Notes (Signed)
Emergency Medicine Provider Triage Evaluation Note  Victoria Hoffman , a 20 y.o. female  was evaluated in triage.  Pt complains of palpitations.  Having worsening anxiety.  Palpitations intermittently during the last 2 days.  Had exposure to HIV.  Was seen by PCP, started on PEP within 12 hours of exposure.  She is requesting additional HIV test here today.  She does feel anxious.  She also feels dehydrated however denies any decreased p.o. intake, increased urination, diarrhea.  She denies any pain  Review of Systems  Positive: Palpitations, requesting HIV test Negative: Chest pain, shortness of breath, abd pain  Physical Exam  BP 134/84 (BP Location: Right Arm)   Pulse (!) 108   Temp 98.3 F (36.8 C)   Resp 18   SpO2 100%  Gen:   Awake, no distress   Resp:  Normal effort  MSK:   Moves extremities without difficulty  Other:    Medical Decision Making  Medically screening exam initiated at 6:17 PM.  Appropriate orders placed.  Victoria Hoffman was informed that the remainder of the evaluation will be completed by another provider, this initial triage assessment does not replace that evaluation, and the importance of remaining in the ED until their evaluation is complete.  Palpitations, requesting HIV testing   Victoria Elm, PA-C 07/31/21 1819    Victoria Dusky, MD 07/31/21 2109

## 2021-07-31 NOTE — Discharge Instructions (Addendum)
I am prescribing a medication called Ativan.  If you are experiencing severe anxiety that you cannot control you can take this medication.  Please only take it as prescribed.  Do not drive a car after taking it.  Do not mix with alcohol.  Please follow-up with your regular doctor regarding your symptoms.  If you develop any new or worsening symptoms please come back to the emergency department.

## 2021-07-31 NOTE — ED Provider Notes (Signed)
[facility] EMERGENCY DEPARTMENT Provider Note   CSN: [id number] Arrival date & time: 07/31/21  1723     History Chief Complaint  Patient presents with   Palpitations    Victoria Hoffman is a 20 y.o. female.  HPI Patient is a 19 year old female with a history of asthma, celiac disease, as well as anxiety who presents to the emergency department due to palpitations.  Patient states that her symptoms began yesterday while at work.  She states that she worked a long shift the day before and also smoked marijuana the night before.  She states that she felt as if she was having worsening anxiety and began having intermittent palpitations during the day as well as last night.  Reports associated chest tightness.  She also notes that she had HIV exposure last month.  She was seen by her PCP immediately after this and started on PEP within 12 hours of exposure.  She had a negative HIV test at that time.  She states that her partner has an undetectable viral load and she states that they were also using protection during this encounter.  Denies any fevers, chills, nausea, vomiting, diarrhea.    Past Medical History:  Diagnosis Date   Asthma    Celiac disease     There are no problems to display for this patient.   History reviewed. No pertinent surgical history.   OB History   No obstetric history on file.     Family History  Problem Relation Age of Onset   Endometriosis Mother    Hyperlipidemia Mother    Hypertension Mother    Celiac disease Mother     Social History   Tobacco Use   Smoking status: Never   Smokeless tobacco: Never  Substance Use Topics   Alcohol use: Yes   Drug use: Yes    Home Medications Prior to Admission medications   Medication Sig Start Date End Date Taking? Authorizing Provider  LORazepam (ATIVAN) 1 MG tablet Take 1 tablet (1 mg total) by mouth daily as needed for anxiety. 07/31/21  Yes Rayna Sexton, PA-C  clindamycin  (CLEOCIN) 300 MG capsule Take 1 capsule (300 mg total) by mouth in the morning and at bedtime. 11/15/19   Jaynee Eagles, PA-C  famotidine (PEPCID) 10 MG tablet Take by mouth.    [provider]  fexofenadine (ALLEGRA) 60 MG tablet Take 60 mg by mouth 2 (two) times daily.    [provider]  fluconazole (DIFLUCAN) 150 MG tablet Take 1 tablet (150 mg total) by mouth once a week. 11/15/19   Jaynee Eagles, PA-C  IBUPROFEN IB PO Take by mouth.    [provider]  norethindrone-ethinyl estradiol (LOESTRIN FE) 1-20 MG-MCG tablet Take 1 tablet by mouth daily.    [provider]    Allergies    Patient has no known allergies.  Review of Systems   Review of Systems  All other systems reviewed and are negative. Ten systems reviewed and are negative for acute change, except as noted in the HPI.   Physical Exam Updated Vital Signs BP 124/80 (BP Location: Right Arm)   Pulse 86   Temp 98.4 F (36.9 C) (Oral)   Resp 16   LMP 06/30/2021 (Within Days)   SpO2 100%   Physical Exam Vitals and nursing note reviewed.  Constitutional:      General: She is not in acute distress.    Appearance: Normal appearance. She is not ill-appearing, toxic-appearing or diaphoretic.  HENT:     Head: Normocephalic and atraumatic.     Right Ear: External ear normal.     Left Ear: External ear normal.     Nose: Nose normal.     Mouth/Throat:     Mouth: Mucous membranes are moist.     Pharynx: Oropharynx is clear. No oropharyngeal exudate or posterior oropharyngeal erythema.  Eyes:     Extraocular Movements: Extraocular movements intact.  Cardiovascular:     Rate and Rhythm: Normal rate and regular rhythm.     Pulses: Normal pulses.     Heart sounds: Normal heart sounds. No murmur heard.   No friction rub. No gallop.  Pulmonary:     Effort: Pulmonary effort is normal. No respiratory distress.     Breath sounds: Normal breath sounds. No stridor. No wheezing, rhonchi or rales.   Abdominal:     General: Abdomen is flat.     Palpations: Abdomen is soft.     Tenderness: There is no abdominal tenderness.     Comments: Abdomen is flat, soft, and nontender  Musculoskeletal:        General: Normal range of motion.     Cervical back: Normal range of motion and neck supple. No tenderness.  Skin:    General: Skin is warm and dry.  Neurological:     General: No focal deficit present.     Mental Status: She is alert and oriented to person, place, and time.  Psychiatric:        Mood and Affect: Mood normal.        Behavior: Behavior normal.    ED Results / Procedures / Treatments   Labs (all labs ordered are listed, but only abnormal results are displayed) Labs Reviewed  CBC WITH DIFFERENTIAL/PLATELET  BASIC METABOLIC PANEL  HIV ANTIBODY (ROUTINE TESTING W REFLEX)  I-STAT BETA HCG BLOOD, ED ([facility], [facility], [facility] ONLY)    EKG None  Radiology DG Chest 2 View  Result Date: 07/31/2021 CLINICAL DATA:  Palpitations and chest pain. EXAM: CHEST - 2 VIEW COMPARISON:  Chest x-ray 06/01/2019 FINDINGS: The heart size and mediastinal contours are within normal limits. Both lungs are clear. The visualized skeletal structures are unremarkable. IMPRESSION: No active cardiopulmonary disease. Electronically Signed   By: Ronney Asters M.D.   On: 07/31/2021 19:12    Procedures Procedures   Medications Ordered in ED Medications  LORazepam (ATIVAN) tablet 1 mg (1 mg Oral Given 07/31/21 2202)    ED Course  I have reviewed the triage vital signs and the nursing notes.  Pertinent labs & imaging results that were available during my care of the patient were reviewed by me and considered in my medical decision making (see chart for details).    MDM Rules/Calculators/A&P                          Pt is a 20 y.o. female who presents to the emergency department due to palpitations.  Labs: CBC, BMP, HIV antibody all without abnormalities.  I-STAT beta-hCG less than 5.  Imaging: Chest  x-ray shows no active cardiopulmonary disease.  I, Rayna Sexton, PA-C, personally reviewed and evaluated these images and lab results as part of my medical decision-making.  Patient notes a history of anxiety and states that she has felt more anxious than normal in the past 24 to 48 hours.  She had an HIV exposure last month and had PEP and notes being concerned that she could  have developed HIV.  HIV antibody obtained today and is once again nonreactive.  Remaining lab work all appears reassuring.  ECG obtained in triage as well as a repeat ECG both appear reassuring.  Patient given a dose of Ativan and states that her symptoms have completely resolved.  She is eating in bed and states that she is ready to be discharged.  She has a ride home.  Will discharge on a short course of Ativan for use for breakthrough symptoms in the future.  We discussed safety regarding this medication.  We discussed return precautions.  Recommended PCP follow-up.  Her questions were answered and she was amicable at the time of discharge.  Note: Portions of this report may have been transcribed using voice recognition software. Every effort was made to ensure accuracy; however, inadvertent computerized transcription errors may be present.   Final Clinical Impression(s) / ED Diagnoses Final diagnoses:  Palpitations   Rx / DC Orders ED Discharge Orders          Ordered    LORazepam (ATIVAN) 1 MG tablet  Daily PRN        07/31/21 2302             Rayna Sexton, PA-C 07/31/21 2327    Luna Fuse, MD 08/03/21 1439

## 2021-07-31 NOTE — ED Triage Notes (Signed)
Pt bib ems with reports of feeling palpitations intermittently X2 days. Pt with anxiety and concerned for HIV despite negative test. Irregular sinus rhythm on 12 lead. VSS with ems.

## 2021-12-27 IMAGING — CR DG CHEST 2V
2 series · 2 of 2 positions shown · non-contrast
Comparison: Chest x-ray 06/01/2019

CLINICAL DATA: Palpitations and chest pain.

EXAM:
CHEST - 2 VIEW

[chest pa]
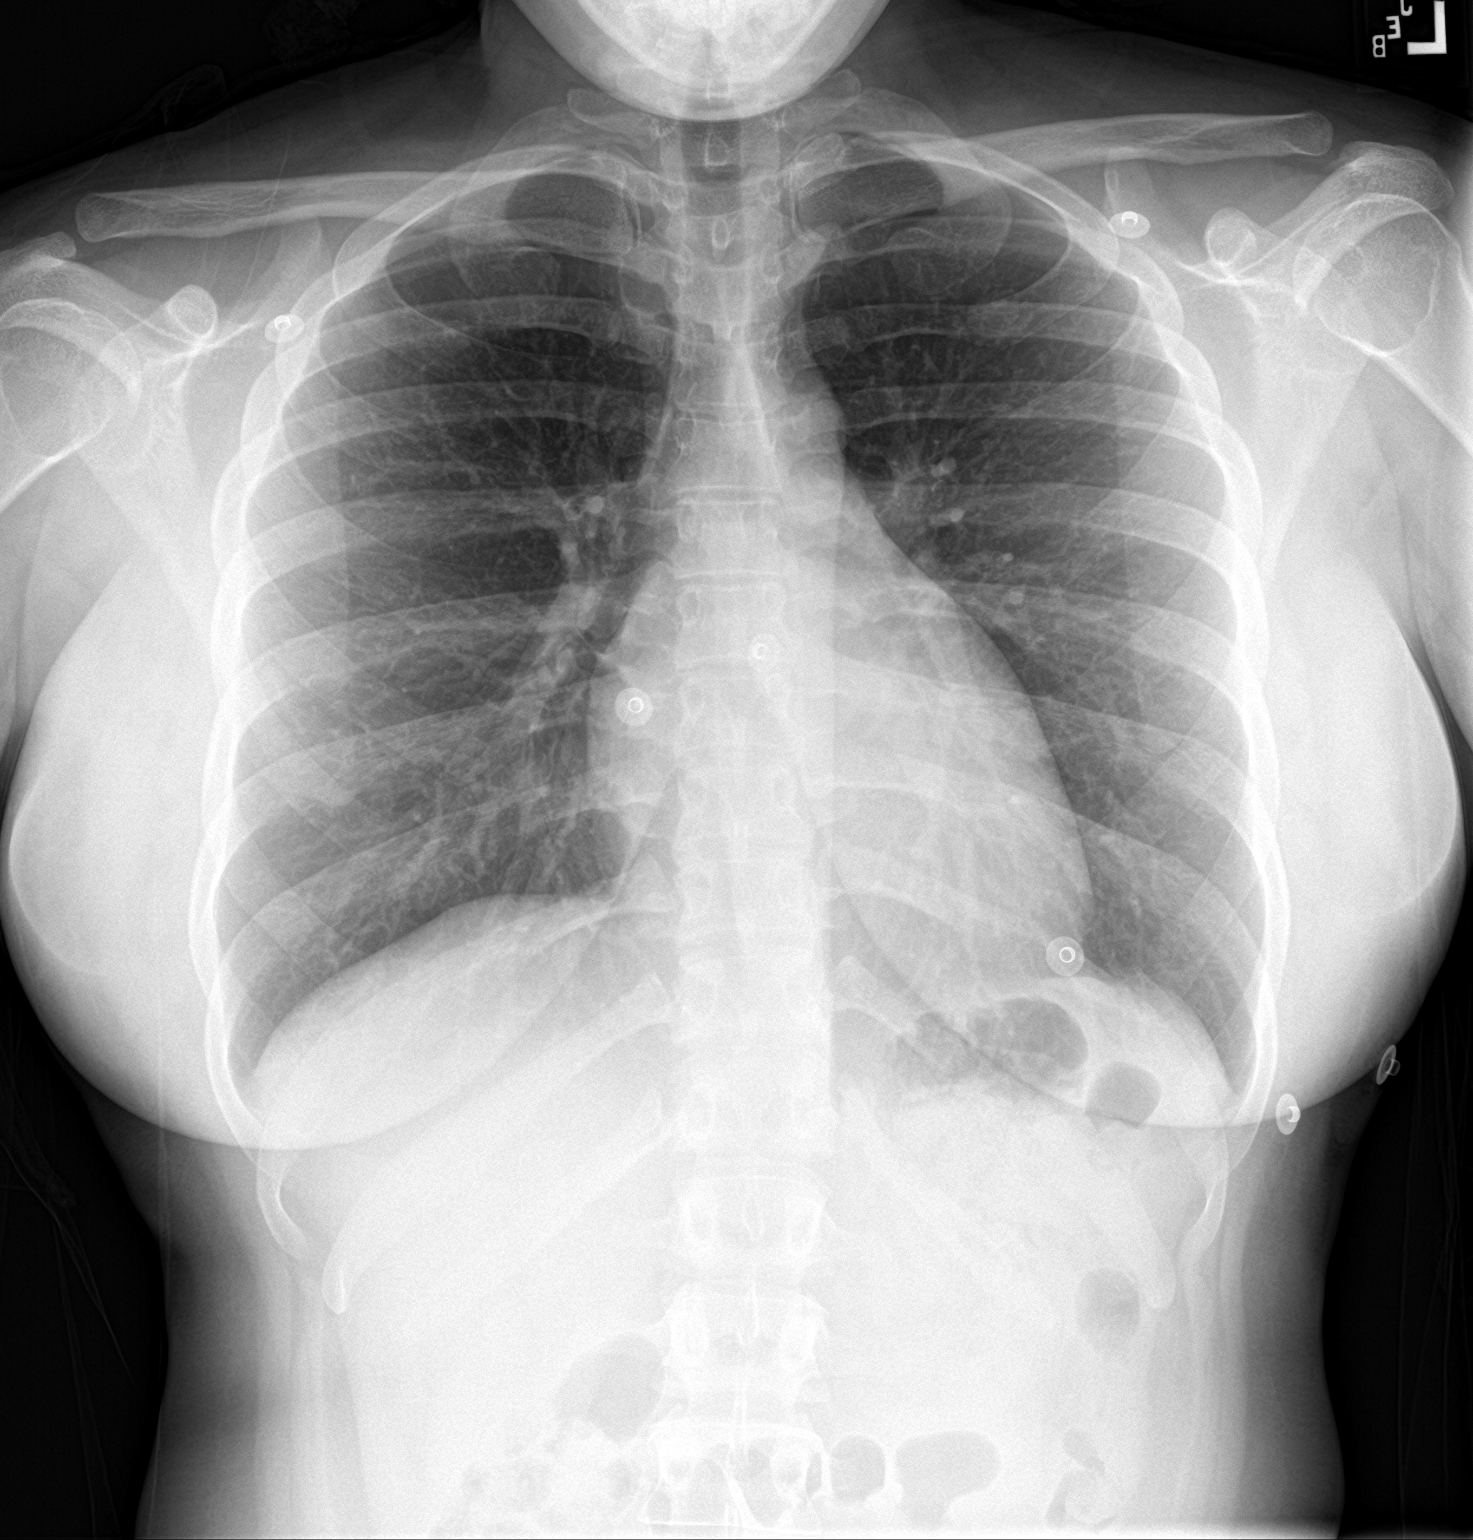

[chest lat]
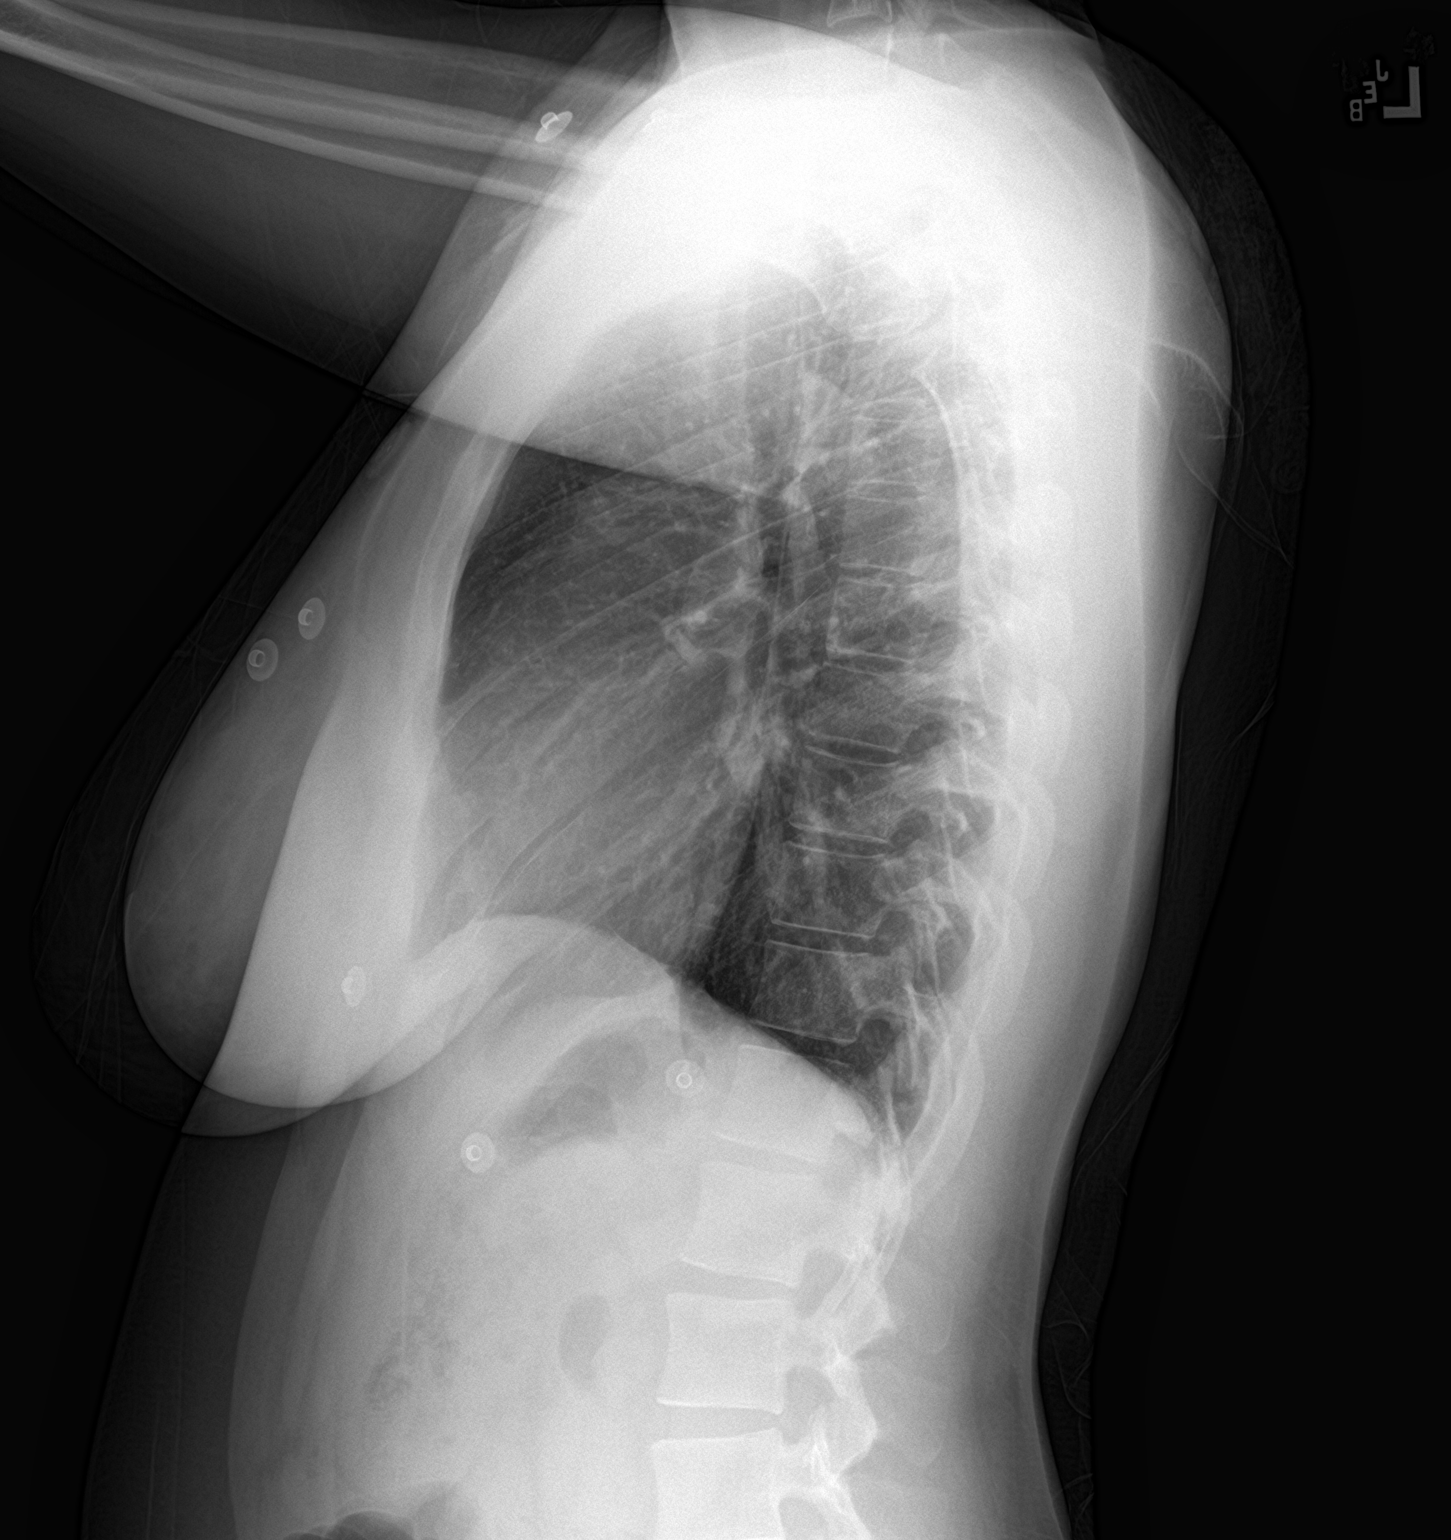

[2 of 2 positions shown; findings below may reference images not displayed]

FINDINGS: The heart size and mediastinal contours are within normal limits.
Both lungs are clear. The visualized skeletal structures are
unremarkable.
IMPRESSION: No active cardiopulmonary disease.

## 2022-03-18 ENCOUNTER — Other Ambulatory Visit: Payer: Self-pay

## 2022-03-18 ENCOUNTER — Emergency Department (HOSPITAL_BASED_OUTPATIENT_CLINIC_OR_DEPARTMENT_OTHER): Payer: BC Managed Care – PPO

## 2022-03-18 ENCOUNTER — Encounter (HOSPITAL_BASED_OUTPATIENT_CLINIC_OR_DEPARTMENT_OTHER): Payer: Self-pay | Admitting: Emergency Medicine

## 2022-03-18 ENCOUNTER — Emergency Department (HOSPITAL_BASED_OUTPATIENT_CLINIC_OR_DEPARTMENT_OTHER)
Admission: EM | Admit: 2022-03-18 | Discharge: 2022-03-18 | Disposition: A | Discharge status: Home / Self Care | Payer: BC Managed Care – PPO | Attending: Emergency Medicine | Admitting: Emergency Medicine

## 2022-03-18 DIAGNOSIS — J45909 Unspecified asthma, uncomplicated: Secondary | ICD-10-CM | POA: Insufficient documentation

## 2022-03-18 DIAGNOSIS — O23591 Infection of other part of genital tract in pregnancy, first trimester: Secondary | ICD-10-CM | POA: Diagnosis not present

## 2022-03-18 DIAGNOSIS — O3111X1 Continuing pregnancy after spontaneous abortion of one fetus or more, first trimester, fetus 1: Secondary | ICD-10-CM | POA: Diagnosis not present

## 2022-03-18 DIAGNOSIS — Z3491 Encounter for supervision of normal pregnancy, unspecified, first trimester: Secondary | ICD-10-CM

## 2022-03-18 DIAGNOSIS — B9689 Other specified bacterial agents as the cause of diseases classified elsewhere: Secondary | ICD-10-CM | POA: Diagnosis not present

## 2022-03-18 DIAGNOSIS — O99511 Diseases of the respiratory system complicating pregnancy, first trimester: Secondary | ICD-10-CM | POA: Diagnosis not present

## 2022-03-18 DIAGNOSIS — O3110X0 Continuing pregnancy after spontaneous abortion of one fetus or more, unspecified trimester, not applicable or unspecified: Secondary | ICD-10-CM

## 2022-03-18 DIAGNOSIS — O26891 Other specified pregnancy related conditions, first trimester: Secondary | ICD-10-CM | POA: Diagnosis present

## 2022-03-18 DIAGNOSIS — Z3A01 Less than 8 weeks gestation of pregnancy: Secondary | ICD-10-CM | POA: Diagnosis not present

## 2022-03-18 DIAGNOSIS — N898 Other specified noninflammatory disorders of vagina: Secondary | ICD-10-CM

## 2022-03-18 LAB — COMPREHENSIVE METABOLIC PANEL
ALT: 6 U/L (ref 0–44)
AST: 20 U/L (ref 15–41)
Albumin: 4.8 g/dL (ref 3.5–5.0)
Alkaline Phosphatase: 35 U/L — ABNORMAL LOW (ref 38–126)
Anion gap: 8 (ref 5–15)
BUN: 7 mg/dL (ref 6–20)
CO2: 25 mmol/L (ref 22–32)
Calcium: 9.9 mg/dL (ref 8.9–10.3)
Chloride: 104 mmol/L (ref 98–111)
Creatinine, Ser: 0.76 mg/dL (ref 0.44–1.00)
GFR, Estimated: >60 mL/min (ref >60)
Glucose, Bld: 74 mg/dL (ref 70–99)
Potassium: 3.9 mmol/L (ref 3.5–5.1)
Sodium: 137 mmol/L (ref 135–145)
Total Bilirubin: 0.4 mg/dL (ref 0.3–1.2)
Total Protein: 8.4 g/dL — ABNORMAL HIGH (ref 6.5–8.1)

## 2022-03-18 LAB — URINALYSIS, ROUTINE W REFLEX MICROSCOPIC
Bilirubin Urine: NEGATIVE
Glucose, UA: NEGATIVE mg/dL
Hgb urine dipstick: NEGATIVE
Ketones, ur: NEGATIVE mg/dL
Leukocytes,Ua: NEGATIVE
Nitrite: NEGATIVE
Specific Gravity, Urine: 1.02 (ref 1.005–1.030)
pH: 7.5 (ref 5.0–8.0)

## 2022-03-18 LAB — CBC WITH DIFFERENTIAL/PLATELET
Abs Immature Granulocytes: 0.02 10*3/uL (ref 0.00–0.07)
Basophils Absolute: 0.1 10*3/uL (ref 0.0–0.1)
Basophils Relative: 1 %
Eosinophils Absolute: 0 10*3/uL (ref 0.0–0.5)
Eosinophils Relative: 1 %
HCT: 40.5 % (ref 36.0–46.0)
Hemoglobin: 13.7 g/dL (ref 12.0–15.0)
Immature Granulocytes: 0 %
Lymphocytes Relative: 31 %
Lymphs Abs: 1.8 10*3/uL (ref 0.7–4.0)
MCH: 29.5 pg (ref 26.0–34.0)
MCHC: 33.8 g/dL (ref 30.0–36.0)
MCV: 87.3 fL (ref 80.0–100.0)
Monocytes Absolute: 0.4 10*3/uL (ref 0.1–1.0)
Monocytes Relative: 7 %
Neutro Abs: 3.3 10*3/uL (ref 1.7–7.7)
Neutrophils Relative %: 60 %
Platelets: 313 10*3/uL (ref 150–400)
RBC: 4.64 MIL/uL (ref 3.87–5.11)
RDW: 13.5 % (ref 11.5–15.5)
WBC: 5.6 10*3/uL (ref 4.0–10.5)
nRBC: 0 % (ref 0.0–0.2)

## 2022-03-18 LAB — HCG, QUANTITATIVE, PREGNANCY: hCG, Beta Chain, Quant, S: 48439 m[IU]/mL — ABNORMAL HIGH (ref <5)

## 2022-03-18 LAB — WET PREP, GENITAL
Sperm: NONE SEEN
Trich, Wet Prep: NONE SEEN
WBC, Wet Prep HPF POC: <10 (ref <10)
Yeast Wet Prep HPF POC: NONE SEEN

## 2022-03-18 LAB — HCG, SERUM, QUALITATIVE: Preg, Serum: POSITIVE — AB

## 2022-03-18 MED ORDER — METRONIDAZOLE 500 MG PO TABS: 500.0000 mg | ORAL_TABLET | Freq: Two times a day (BID) | ORAL | 0 refills | Status: DC

## 2022-03-18 MED ORDER — CLINDAMYCIN HCL 150 MG PO CAPS: 300.0000 mg | ORAL_CAPSULE | Freq: Two times a day (BID) | ORAL | 0 refills | Status: AC

## 2022-03-18 NOTE — ED Triage Notes (Signed)
Pt is about [redacted] weeks pregnant she thinks , she has been having some mucus discharge that she isnt sure is normal and some cramping. She is to see ob on Tuesday.

## 2022-03-18 NOTE — ED Provider Notes (Signed)
[place] EMERGENCY DEPT Provider Note   CSN: [id number] Arrival date & time: 03/18/22  2542     History  Chief Complaint  Patient presents with   Abdominal Pain    Victoria Hoffman is a 21 y.o. female.  HPI     21 year old female with a history of asthma, celiac disease G1 with LMP June 2 estimated 6 to [redacted] weeks pregnant, who presents with concern for abdominal discomfort and vaginal discharge.  Reports for the last 2 days she has been having some yellow/white discharge and is not sure if it is normal.  She has had abdominal cramping over the last couple weeks since she found out she was pregnant.  She has had nausea but no vomiting.  Denies any dysuria, fever, flank pain, diarrhea or constipation.  She is scheduled to see an OB on Tuesday.   Past Medical History:  Diagnosis Date   Asthma    Celiac disease      Home Medications Prior to Admission medications   Medication Sig Start Date End Date Taking? Authorizing Provider  clindamycin (CLEOCIN) 150 MG capsule Take 2 capsules (300 mg total) by mouth 2 (two) times daily for 7 days. 03/18/22 03/25/22 Yes Gareth Morgan, MD  famotidine (PEPCID) 10 MG tablet Take by mouth.    [provider]  fexofenadine (ALLEGRA) 60 MG tablet Take 60 mg by mouth 2 (two) times daily.    [provider]  fluconazole (DIFLUCAN) 150 MG tablet Take 1 tablet (150 mg total) by mouth once a week. 11/15/19   Jaynee Eagles, PA-C  IBUPROFEN IB PO Take by mouth.    [provider]  LORazepam (ATIVAN) 1 MG tablet Take 1 tablet (1 mg total) by mouth daily as needed for anxiety. 07/31/21   Rayna Sexton, PA-C  norethindrone-ethinyl estradiol (LOESTRIN FE) 1-20 MG-MCG tablet Take 1 tablet by mouth daily.    [provider]      Allergies    Patient has no known allergies.    Review of Systems   Review of Systems  Physical Exam Updated Vital Signs BP (!) 121/59 (BP Location: Right Arm)   Pulse 80    Temp 98.4 F (36.9 C) (Oral)   Resp 16   SpO2 98%  Physical Exam Vitals and nursing note reviewed.  Constitutional:      General: She is not in acute distress.    Appearance: She is well-developed. She is not diaphoretic.  HENT:     Head: Normocephalic and atraumatic.  Eyes:     Conjunctiva/sclera: Conjunctivae normal.  Cardiovascular:     Rate and Rhythm: Normal rate and regular rhythm.     Heart sounds: Normal heart sounds. No murmur heard.    No friction rub. No gallop.  Pulmonary:     Effort: Pulmonary effort is normal. No respiratory distress.     Breath sounds: Normal breath sounds. No wheezing or rales.  Abdominal:     General: There is no distension.     Palpations: Abdomen is soft.     Tenderness: There is no abdominal tenderness. There is no guarding.  Genitourinary:    Uterus: Not enlarged.      Adnexa:        Right: Tenderness present.        Left: No tenderness.    Musculoskeletal:        General: No tenderness.     Cervical back: Normal range of motion.  Skin:    General: Skin is  warm and dry.     Findings: No erythema or rash.  Neurological:     Mental Status: She is alert and oriented to person, place, and time.     ED Results / Procedures / Treatments   Labs (all labs ordered are listed, but only abnormal results are displayed) Labs Reviewed  WET PREP, GENITAL - Abnormal; Notable for the following components:      Result Value   Clue Cells Wet Prep HPF POC PRESENT (*)    All other components within normal limits  URINALYSIS, ROUTINE W REFLEX MICROSCOPIC - Abnormal; Notable for the following components:   APPearance HAZY (*)    Protein, ur TRACE (*)    Bacteria, UA RARE (*)    All other components within normal limits  HCG, SERUM, QUALITATIVE - Abnormal; Notable for the following components:   Preg, Serum POSITIVE (*)    All other components within normal limits  HCG, QUANTITATIVE, PREGNANCY - Abnormal; Notable for the following components:    hCG, Beta Chain, Quant, S [id number] (*)    All other components within normal limits  COMPREHENSIVE METABOLIC PANEL - Abnormal; Notable for the following components:   Total Protein 8.4 (*)    Alkaline Phosphatase 35 (*)    All other components within normal limits  CBC WITH DIFFERENTIAL/PLATELET  GC/CHLAMYDIA PROBE AMP ([place]) NOT AT [facility]    EKG None  Radiology US OB LESS THAN 14 WEEKS WITH OB TRANSVAGINAL  Result Date: 03/18/2022 CLINICAL DATA:  Pelvic pain and cramping for 2 days. EXAM: TWIN OBSTETRIC <14WK [place] AND TRANSVAGINAL OB US TECHNIQUE: Both transabdominal and transvaginal ultrasound examinations were performed for complete evaluation of the gestation as well as the maternal uterus, adnexal regions, and pelvic cul-de-sac. Transvaginal technique was performed to assess early pregnancy. COMPARISON:  None Available. FINDINGS: Number of IUPs:  2 Chorionicity/Amnionicity:  Dichorionic-diamniotic (thick membrane) TWIN 1 Yolk sac:  Visualized. Embryo:  Visualized. Cardiac Activity: Visualized. Heart Rate: 101 bpm CRL:  6 mm   6 w 3 d                  [place] EDC: 11/08/2022 TWIN 2 Yolk sac:  Not Visualized. Embryo:  Not Visualized. MSD: 6 mm   5 w   2 d Subchorionic hemorrhage:  None visualized. Maternal uterus/adnexae: Both ovaries are normal in appearance. No mass or abnormal free fluid identified. IMPRESSION: Dichorionic twin IUP, with single living gestation with estimated gestational age of [redacted] weeks 3 days. And "vanishing" twin gestation. Electronically Signed   By: Marlaine Hind M.D.   On: 03/18/2022 14:25   US OB Comp AddL Gest Less 14 Wks  Result Date: 03/18/2022 CLINICAL DATA:  Pelvic pain and cramping for 2 days. EXAM: TWIN OBSTETRIC <14WK [place] AND TRANSVAGINAL OB US TECHNIQUE: Both transabdominal and transvaginal ultrasound examinations were performed for complete evaluation of the gestation as well as the maternal uterus, adnexal regions, and pelvic cul-de-sac. Transvaginal technique was  performed to assess early pregnancy. COMPARISON:  None Available. FINDINGS: Number of IUPs:  2 Chorionicity/Amnionicity:  Dichorionic-diamniotic (thick membrane) TWIN 1 Yolk sac:  Visualized. Embryo:  Visualized. Cardiac Activity: Visualized. Heart Rate: 101 bpm CRL:  6 mm   6 w 3 d                  [place] EDC: 11/08/2022 TWIN 2 Yolk sac:  Not Visualized. Embryo:  Not Visualized. MSD: 6 mm   5 w   2 d Subchorionic hemorrhage:  None visualized. Maternal uterus/adnexae: Both ovaries are normal in appearance. No mass or abnormal free fluid identified. IMPRESSION: Dichorionic twin IUP, with single living gestation with estimated gestational age of [redacted] weeks 3 days. And "vanishing" twin gestation. Electronically Signed   By: Marlaine Hind M.D.   On: 03/18/2022 14:25    Procedures Procedures    Medications Ordered in ED Medications - No data to display  ED Course/ Medical Decision Making/ A&P                           Medical Decision Making Amount and/or Complexity of Data Reviewed Labs: ordered. Radiology: ordered.  Risk Prescription drug management.    21 year old female with a history of asthma, celiac disease G1 with LMP June 2 estimated 6 to [redacted] weeks pregnant, who presents with concern for abdominal discomfort and vaginal discharge.  Have low suspicion for appendicitis, diverticulitis, small bowel obstruction, nephrolithiasis by history and exam.  Labs were personally evaluated by me and interpreted and showed no sign of hepatitis, normal hemoglobin, no leukocytosis.  Urinalysis shows no evidence of infection.  Transvaginal ultrasound was ordered to evaluate for signs of ectopic pregnancy in the setting of having abdominal pain.  TVUS shows dichorionic twin IUP with single living gestation with estimated gestational age [redacted]wk 3 days and vanishing twin gestation.  Discussed findings with patient, father and family members at bedside.  Wet prep with BV, consider symptomatic BV. Does not  tolerate flagyl, will treat with clindamycin.  Discussed reasson to return and encouraged OB follow up as scheduled this week.        Final Clinical Impression(s) / ED Diagnoses Final diagnoses:  First trimester pregnancy  Vaginal discharge  Bacterial vaginosis  Vanishing twin syndrome    Rx / DC Orders ED Discharge Orders          Ordered    metroNIDAZOLE (FLAGYL) 500 MG tablet  2 times daily,   Status:  Discontinued        03/18/22 1438    clindamycin (CLEOCIN) 150 MG capsule  2 times daily        03/18/22 1451              Gareth Morgan, MD 03/18/22 2202

## 2022-03-18 NOTE — ED Notes (Addendum)
Yesterday started to have cramping, x3 episodes and each lasted about 10 mins. After the first episode, Pt noted that she was starting to have discharge, described it as a brown sticky substance. Roughly 6 weeks preg, according to prev blood work. No N/V or any other aliment.

## 2022-03-20 LAB — GC/CHLAMYDIA PROBE AMP (~~LOC~~) NOT AT ARMC
Chlamydia: NEGATIVE
Comment: NEGATIVE
Comment: NORMAL
Neisseria Gonorrhea: NEGATIVE

## 2022-03-29 LAB — OB RESULTS CONSOLE HEPATITIS B SURFACE ANTIGEN: Hepatitis B Surface Ag: NEGATIVE

## 2022-03-29 LAB — OB RESULTS CONSOLE RUBELLA ANTIBODY, IGM: Rubella: IMMUNE

## 2022-04-01 ENCOUNTER — Inpatient Hospital Stay (HOSPITAL_COMMUNITY)
Admission: AD | Admit: 2022-04-01 | Discharge: 2022-04-01 | Disposition: A | Discharge status: Home / Self Care | Payer: BC Managed Care – PPO | Attending: Obstetrics and Gynecology | Admitting: Obstetrics and Gynecology

## 2022-04-01 ENCOUNTER — Encounter (HOSPITAL_COMMUNITY): Payer: Self-pay | Admitting: *Deleted

## 2022-04-01 DIAGNOSIS — Z3A08 8 weeks gestation of pregnancy: Secondary | ICD-10-CM | POA: Insufficient documentation

## 2022-04-01 DIAGNOSIS — O26891 Other specified pregnancy related conditions, first trimester: Secondary | ICD-10-CM | POA: Insufficient documentation

## 2022-04-01 DIAGNOSIS — O26899 Other specified pregnancy related conditions, unspecified trimester: Secondary | ICD-10-CM

## 2022-04-01 DIAGNOSIS — R109 Unspecified abdominal pain: Secondary | ICD-10-CM | POA: Insufficient documentation

## 2022-04-01 HISTORY — DX: Herpesviral infection, unspecified: B00.9

## 2022-04-01 NOTE — MAU Note (Signed)
Lower abdominal pain started 17mn ago. No vaginal bleeding. Pain was a 10 and now its a 0-1

## 2022-04-01 NOTE — MAU Provider Note (Signed)
History     CSN: [id number]  Arrival date and time: 04/01/22 1348   None     Chief Complaint  Patient presents with   Abdominal Pain   HPI Victoria Hoffman is a 21 y.o. G1P0 41w3dby 6 week ultrasound who presents to MAU for sudden onset lower abdominal pain. Patient reports approximately 1 hour prior to arrival she started having a sharp stabbing pain in lower abdomen. Patient was at work when pain started. She reports she sat down and pain continued to worsen so she called EMS. She is not currently having the pain and thinks that pain could be gas-related, however endorses that she has some anxiety with this pregnancy as this was initially a twin pregnancy however lost one of the baby's. She denies vaginal bleeding, urinary s/s, constipation, or diarrhea. Is currently being treated for BV.  [place]  OB History     Gravida  1   Para      Term      Preterm      AB      Living         SAB      IAB      Ectopic      Multiple      Live Births              Past Medical History:  Diagnosis Date   Asthma    Celiac disease    HSV-1 (herpes simplex virus 1) infection     Past Surgical History:  Procedure Laterality Date   LAPAROSCOPY      Family History  Problem Relation Age of Onset   Endometriosis Mother    Hyperlipidemia Mother    Hypertension Mother    Celiac disease Mother     Social History   Tobacco Use   Smoking status: Never   Smokeless tobacco: Never  Vaping Use   Vaping Use: Never used  Substance Use Topics   Alcohol use: Not Currently   Drug use: Not Currently    Allergies: No Known Allergies  No medications prior to admission.    Review of Systems  Constitutional: Negative.   Respiratory: Negative.    Gastrointestinal:  Positive for abdominal pain.  Genitourinary: Negative.   Neurological: Negative.    Physical Exam  Patient Vitals for the past 24 hrs:  BP Temp Temp src Pulse Resp SpO2   04/01/22 1350 119/69 98.5 F (36.9 C) Oral 76 18 100 %   Physical Exam Vitals and nursing note reviewed.  Cardiovascular:     Rate and Rhythm: Normal rate.  Pulmonary:     Effort: Pulmonary effort is normal.  Abdominal:     Palpations: Abdomen is soft.     Tenderness: There is no abdominal tenderness. There is no guarding.  Skin:    General: Skin is warm and dry.  Neurological:     General: No focal deficit present.     Mental Status: She is alert and oriented to person, place, and time.  Psychiatric:        Mood and Affect: Mood normal.        Behavior: Behavior normal.    MAU Course  Procedures  MDM Patient not currently having pain, however expressing anxiety related to pregnancy given vanishing twin syndrome. BSUS completed by N. Nugent, NP- see separate note. IUP with FHR present. Patient currently being treated for BV so vaginal swabs deferred. Pain likely  GI related given sudden onset/relief. Patient instructed to keep NOB appointment as scheduled. Strict return precautions reviewed at length.   Assessment and Plan  [redacted] weeks gestation of pregnancy Abdominal pain affecting pregnancy  - Discharge home in stable condition - Strict return precautions. Return to MAU as needed for worsening symptoms - List of safe meds in pregnancy provided - Keep OB appointment as scheduled on 8/11   Renee Harder, [place] 04/01/2022, 4:57 PM

## 2022-04-01 NOTE — MAU Provider Note (Signed)
Pt informed that the ultrasound is considered a limited OB ultrasound and is not intended to be a complete ultrasound exam.  Patient also informed that the ultrasound is not being completed with the intent of assessing for fetal or placental anomalies or any pelvic abnormalities.  Explained that the purpose of today's ultrasound is to assess for  viability (FHR 160).  Patient acknowledges the purpose of the exam and the limitations of the study.    Clarisa Fling, NP  3:22 PM 04/01/2022

## 2022-04-14 ENCOUNTER — Other Ambulatory Visit: Payer: Self-pay | Admitting: Obstetrics and Gynecology

## 2022-04-14 ENCOUNTER — Other Ambulatory Visit (HOSPITAL_COMMUNITY)
Admission: RE | Admit: 2022-04-14 | Discharge: 2022-04-14 | Disposition: A | Discharge status: Home / Self Care | Payer: BC Managed Care – PPO | Source: Ambulatory Visit | Attending: Obstetrics and Gynecology | Admitting: Obstetrics and Gynecology

## 2022-04-14 DIAGNOSIS — Z3A00 Weeks of gestation of pregnancy not specified: Secondary | ICD-10-CM | POA: Insufficient documentation

## 2022-04-14 DIAGNOSIS — Z3490 Encounter for supervision of normal pregnancy, unspecified, unspecified trimester: Secondary | ICD-10-CM | POA: Insufficient documentation

## 2022-04-19 LAB — CYTOLOGY - PAP: Diagnosis: NEGATIVE

## 2022-06-20 ENCOUNTER — Encounter (HOSPITAL_COMMUNITY): Payer: Self-pay | Admitting: Obstetrics and Gynecology

## 2022-06-20 ENCOUNTER — Inpatient Hospital Stay (HOSPITAL_COMMUNITY)
Admission: AD | Admit: 2022-06-20 | Discharge: 2022-06-20 | Discharge status: Against Medical Advice | Payer: BC Managed Care – PPO | Attending: Obstetrics and Gynecology | Admitting: Obstetrics and Gynecology

## 2022-06-20 DIAGNOSIS — O99512 Diseases of the respiratory system complicating pregnancy, second trimester: Secondary | ICD-10-CM | POA: Diagnosis present

## 2022-06-20 DIAGNOSIS — Z5329 Procedure and treatment not carried out because of patient's decision for other reasons: Secondary | ICD-10-CM | POA: Insufficient documentation

## 2022-06-20 DIAGNOSIS — R002 Palpitations: Secondary | ICD-10-CM | POA: Insufficient documentation

## 2022-06-20 DIAGNOSIS — Z3A19 19 weeks gestation of pregnancy: Secondary | ICD-10-CM | POA: Diagnosis present

## 2022-06-20 DIAGNOSIS — O98512 Other viral diseases complicating pregnancy, second trimester: Secondary | ICD-10-CM | POA: Diagnosis present

## 2022-06-20 LAB — COMPREHENSIVE METABOLIC PANEL
ALT: 10 U/L (ref 0–44)
AST: 12 U/L — ABNORMAL LOW (ref 15–41)
Albumin: 2.9 g/dL — ABNORMAL LOW (ref 3.5–5.0)
Alkaline Phosphatase: 49 U/L (ref 38–126)
Anion gap: 8 (ref 5–15)
BUN: <5 mg/dL — ABNORMAL LOW (ref 6–20)
CO2: 22 mmol/L (ref 22–32)
Calcium: 8.8 mg/dL — ABNORMAL LOW (ref 8.9–10.3)
Chloride: 107 mmol/L (ref 98–111)
Creatinine, Ser: 0.62 mg/dL (ref 0.44–1.00)
GFR, Estimated: >60 mL/min (ref >60)
Glucose, Bld: 98 mg/dL (ref 70–99)
Potassium: 3.2 mmol/L — ABNORMAL LOW (ref 3.5–5.1)
Sodium: 137 mmol/L (ref 135–145)
Total Bilirubin: 0.3 mg/dL (ref 0.3–1.2)
Total Protein: 6.2 g/dL — ABNORMAL LOW (ref 6.5–8.1)

## 2022-06-20 LAB — CBC WITH DIFFERENTIAL/PLATELET
Abs Immature Granulocytes: 0.21 10*3/uL — ABNORMAL HIGH (ref 0.00–0.07)
Basophils Absolute: 0.1 10*3/uL (ref 0.0–0.1)
Basophils Relative: 0 %
Eosinophils Absolute: 0.1 10*3/uL (ref 0.0–0.5)
Eosinophils Relative: 1 %
HCT: 30.6 % — ABNORMAL LOW (ref 36.0–46.0)
Hemoglobin: 10.3 g/dL — ABNORMAL LOW (ref 12.0–15.0)
Immature Granulocytes: 1 %
Lymphocytes Relative: 16 %
Lymphs Abs: 2.4 10*3/uL (ref 0.7–4.0)
MCH: 29.4 pg (ref 26.0–34.0)
MCHC: 33.7 g/dL (ref 30.0–36.0)
MCV: 87.4 fL (ref 80.0–100.0)
Monocytes Absolute: 1.4 10*3/uL — ABNORMAL HIGH (ref 0.1–1.0)
Monocytes Relative: 9 %
Neutro Abs: 11 10*3/uL — ABNORMAL HIGH (ref 1.7–7.7)
Neutrophils Relative %: 73 %
Platelets: 276 10*3/uL (ref 150–400)
RBC: 3.5 MIL/uL — ABNORMAL LOW (ref 3.87–5.11)
RDW: 13.6 % (ref 11.5–15.5)
WBC: 15.1 10*3/uL — ABNORMAL HIGH (ref 4.0–10.5)
nRBC: 0 % (ref 0.0–0.2)

## 2022-06-20 NOTE — MAU Note (Signed)
.  Victoria Hoffman is a 21 y.o. at 74w6dhere in MAU reporting: SOB with exertion and at rest that has been on-going but has since gotten worse today. She also reports feeling palpitations and checking her pulse and it being tachycardic at home. She was seen in urgent care yesterday for a rash and reports that her EKG was normal. Denies VB or LOF.  LMP: N/A Onset of complaint: On-going Pain score: denies pain Vitals:   06/20/22 2102  BP: 129/78  Pulse: 94  Resp: (!) 22  Temp: 98.3 F (36.8 C)  SpO2: 100%     FHT:140 Lab orders placed from triage:  UA

## 2022-06-20 NOTE — MAU Note (Signed)
Patient transferred to main ED.

## 2022-06-20 NOTE — ED Notes (Signed)
Patient states the wait will be too long and she is leaving

## 2022-06-20 NOTE — ED Triage Notes (Signed)
Patient advised RN that she is leaving , " can't wait any longer .

## 2022-06-20 NOTE — MAU Provider Note (Cosign Needed Addendum)
History     CSN: [id number]  Arrival date and time: 06/20/22 2047   Event Date/Time   First Provider Initiated Contact with Patient 06/20/22 2113      Chief Complaint  Patient presents with   Shortness of Breath   Palpitations   Victoria Hoffman is a 21 y.o. G1P0 at 13w6dwho presents today with shortness of breath and palpations. She states that this has been occurring for about 2 weeks, but in the last few days it has increased in frequency. She states that it is now happening multiple times per day.   She denies any OB complaints. She denies any abdominal pain, contractions, VB or  LOF.   Shortness of Breath This is a new problem. The current episode started in the past 7 days. The problem occurs intermittently. The problem has been gradually worsening. Pertinent negatives include no chest pain or fever. Exacerbated by: standing. Risk factors: pregnancy. She has tried nothing for the symptoms.  Palpitations  This is a new problem. The current episode started in the past 7 days. The problem occurs intermittently. The problem has been gradually worsening. Nothing aggravates the symptoms. Associated symptoms include dizziness and shortness of breath. Pertinent negatives include no chest pain or fever. She has tried nothing for the symptoms.    OB History     Gravida  1   Para      Term      Preterm      AB      Living         SAB      IAB      Ectopic      Multiple      Live Births              Past Medical History:  Diagnosis Date   Asthma    Celiac disease    HSV-1 (herpes simplex virus 1) infection     Past Surgical History:  Procedure Laterality Date   LAPAROSCOPY      Family History  Problem Relation Age of Onset   Endometriosis Mother    Hyperlipidemia Mother    Hypertension Mother    Celiac disease Mother     Social History   Tobacco Use   Smoking status: Never   Smokeless tobacco: Never  Vaping Use   Vaping Use: Never used   Substance Use Topics   Alcohol use: Not Currently   Drug use: Not Currently    Allergies: No Known Allergies  Medications Prior to Admission  Medication Sig Dispense Refill Last Dose   Prenatal Vit-Fe Fumarate-FA (PRENATAL MULTIVITAMIN) TABS tablet Take 1 tablet by mouth daily at 12 noon.   06/20/2022   valACYclovir (VALTREX) 500 MG tablet Take 500 mg by mouth 2 (two) times daily.   Past Month   famotidine (PEPCID) 10 MG tablet Take by mouth.      fexofenadine (ALLEGRA) 60 MG tablet Take 60 mg by mouth 2 (two) times daily.       Review of Systems  Constitutional:  Negative for fever.  Respiratory:  Positive for shortness of breath.   Cardiovascular:  Positive for palpitations. Negative for chest pain.  Neurological:  Positive for dizziness.  All other systems reviewed and are negative.  Physical Exam   Blood pressure 129/78, pulse 94, temperature 98.3 F (36.8 C), temperature source Oral, resp. rate (!) 22, height 5' 1.5" (1.562 m), weight 73.7 kg, SpO2 100 %.  Physical Exam Constitutional:  Appearance: She is well-developed.  HENT:     Head: Normocephalic.  Eyes:     Pupils: Pupils are equal, round, and reactive to light.  Cardiovascular:     Rate and Rhythm: Normal rate and regular rhythm.     Heart sounds: Normal heart sounds.  Pulmonary:     Effort: Pulmonary effort is normal. No respiratory distress.     Breath sounds: Normal breath sounds.  Abdominal:     Palpations: Abdomen is soft.     Tenderness: There is no abdominal tenderness.  Genitourinary:    Vagina: No bleeding. Vaginal discharge: mucusy.    Comments: External: no lesion Vagina: small amount of white discharge     Musculoskeletal:        General: Normal range of motion.     Cervical back: Normal range of motion and neck supple.  Skin:    General: Skin is warm and dry.  Neurological:     Mental Status: She is alert and oriented to person, place, and time.  Psychiatric:        Mood and  Affect: Mood normal.        Behavior: Behavior normal.    FHT 140 with doppler   MAU Course  Procedures  MDM  2123: DW. Dr. Alvino Chapel in the ED he accepts transfer. Will get CBC/CMET and EKG here prior to sending.   Assessment and Plan  Shortness of breath Palpitations  Transfer to the ED for further evaluation     Marcille Buffy DNP, CNM  06/20/22  9:35 PM

## 2022-06-21 ENCOUNTER — Other Ambulatory Visit: Payer: Self-pay

## 2022-06-21 ENCOUNTER — Ambulatory Visit (INDEPENDENT_AMBULATORY_CARE_PROVIDER_SITE_OTHER): Payer: BC Managed Care – PPO | Admitting: Certified Nurse Midwife

## 2022-06-21 VITALS — BP 117/76 | HR 101 | Wt 162.9 lb

## 2022-06-21 DIAGNOSIS — Z3A19 19 weeks gestation of pregnancy: Secondary | ICD-10-CM

## 2022-06-21 DIAGNOSIS — O0932 Supervision of pregnancy with insufficient antenatal care, second trimester: Secondary | ICD-10-CM

## 2022-06-21 DIAGNOSIS — O99012 Anemia complicating pregnancy, second trimester: Secondary | ICD-10-CM

## 2022-06-21 DIAGNOSIS — Z3492 Encounter for supervision of normal pregnancy, unspecified, second trimester: Secondary | ICD-10-CM

## 2022-06-21 DIAGNOSIS — R002 Palpitations: Secondary | ICD-10-CM

## 2022-06-21 DIAGNOSIS — Z3490 Encounter for supervision of normal pregnancy, unspecified, unspecified trimester: Secondary | ICD-10-CM | POA: Insufficient documentation

## 2022-06-21 DIAGNOSIS — A6004 Herpesviral vulvovaginitis: Secondary | ICD-10-CM

## 2022-06-21 DIAGNOSIS — O99019 Anemia complicating pregnancy, unspecified trimester: Secondary | ICD-10-CM

## 2022-06-21 NOTE — Progress Notes (Unsigned)
Is patient a CenteringPregnancy candidate?  Declined; provder preference

## 2022-06-21 NOTE — Patient Instructions (Signed)
Start taking Blood Builder by [employer]

## 2022-06-22 DIAGNOSIS — A6000 Herpesviral infection of urogenital system, unspecified: Secondary | ICD-10-CM | POA: Insufficient documentation

## 2022-06-22 DIAGNOSIS — J45909 Unspecified asthma, uncomplicated: Secondary | ICD-10-CM | POA: Insufficient documentation

## 2022-06-22 NOTE — Progress Notes (Addendum)
Transfer of care from [place]  History:   Victoria Hoffman is a 21 y.o. G1P0 at 38w0dby LMP being seen today for her first obstetrical visit.  Her obstetrical history is not significant. Patient does intend to breast feed and plans to use condoms after this baby. 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 Pregnancy history fully reviewed.  Patient reports  heart palpitations, often at rest with heartrate going from 60s to 100s quickly and causing shortness of breath. Has been seen at MAU for this and is getting more and more concerned. Felt her concerns were not being heard and was referred to me by colleages at [employer]. Otherwise doing well and excited about this pregnancy .   HISTORY: OB History  Gravida Para Term Preterm AB Living  1 0 0 0 0 0  SAB IAB Ectopic Multiple Live Births  0 0 0 0 0    # Outcome Date GA Lbr Len/2nd Weight Sex Delivery Anes PTL Lv  1 Current             Last pap smear was done 04/14/2022 and was normal  Past Medical History:  Diagnosis Date   Asthma    Celiac disease    HSV-1 (herpes simplex virus 1) infection    Past Surgical History:  Procedure Laterality Date   LAPAROSCOPY     Family History  Problem Relation Age of Onset   Endometriosis Mother    Hyperlipidemia Mother    Hypertension Mother    Celiac disease Mother    Social History   Tobacco Use   Smoking status: Never   Smokeless tobacco: Never  Vaping Use   Vaping Use: Never used  Substance Use Topics   Alcohol use: Not Currently   Drug use: Not Currently   Allergies  Allergen Reactions   Metronidazole Nausea And Vomiting   Wheat Bran Other (See Comments) and Rash   Current Outpatient Medications on File Prior to Visit  Medication Sig Dispense Refill   Albuterol Sulfate, sensor, 108 (90 Base) MCG/ACT AEPB Inhale into the lungs.     clotrimazole-betamethasone (LOTRISONE) cream Apply 2x per day to the affected area for 2 weeks     famotidine (PEPCID) 10 MG tablet Take by mouth.      fexofenadine (ALLEGRA) 60 MG tablet Take 60 mg by mouth 2 (two) times daily.     Prenatal Vit-Fe Fumarate-FA (PRENATAL MULTIVITAMIN) TABS tablet Take 1 tablet by mouth daily at 12 noon.     valACYclovir (VALTREX) 500 MG tablet Take 500 mg by mouth 2 (two) times daily.     No current facility-administered medications on file prior to visit.    Review of Systems Pertinent items noted in HPI and remainder of comprehensive ROS otherwise negative. Physical Exam:   Vitals:   06/21/22 1355  BP: 117/76  Pulse: (!) 101  Weight: 162 lb 14.4 oz (73.9 kg)   Fetal Heart Rate (bpm): 140  Constitutional: Well-developed, well-nourished pregnant female in no acute distress.  HEENT: PERRLA Skin: normal color and turgor, no rash Cardiovascular: normal rate & rhythm, warm and well perfused Respiratory: normal effort, although did sound mildly SOB with fast talking. Able to catch her breath easily GI: Abd soft, non-tender, pos BS x 4, gravid appropriate for gestational age MS: Extremities nontender, no edema, normal ROM Neurologic: Alert and oriented x 4.  GU: no CVA tenderness Pelvic: exam deferred   Assessment & Plan:  1. Encounter for supervision of low-risk pregnancy in  second trimester - Doing well, feeling regular and vigorous fetal movement   2. [redacted] weeks gestation of pregnancy - Routine OB care, will review labs once abstracted from records  3. Palpitations - Likely related to anemia, reassurance given but also referred to [facility] for full evaluation - AMB Referral to [place]  4. Anemia during pregnancy - Prior to pregnancy, Hgb was 14, now 10.3 which is not severely anemic but enough of a fall to potentially cause these symptoms. - Advised to switch prenatal to Blood Builder by [employer] which will replete her iron and has well-absorbed folate  5. HSV-1 infection (but genital) - Had an outbreak toward the beginning of pregnancy and is on Valtrex, will continue through  pregnancy  5. Initial obstetric visit in second trimester - Problem list reviewed and updated. - Genetic Screening discussed, already completed and reviewed - Anatomy scan already completed, will review once records in - Anticipatory guidance about prenatal visits given including labs, ultrasounds, and testing. - Discussed usage of Babyscripts and virtual visits as additional source of managing and completing prenatal visits in midst of coronavirus and pandemic.   - Encouraged to complete MyChart Registration for her ability to review results, send requests, and have questions addressed.  - The nature of [place] for [facility] with multiple MDs and Advanced Practice Providers was explained to patient; also emphasized that residents, students are part of our team. - Routine obstetric precautions reviewed. Encouraged to seek out care at office or emergency room [facility] MAU preferred) for urgent and/or emergent concerns.  Return in about 4 weeks (around 07/19/2022) for IN-PERSON, LOB.     Gaylan Gerold, MSN, CNM, [place] Certified Nurse Midwife, [facility]

## 2022-07-04 ENCOUNTER — Encounter: Payer: Self-pay | Admitting: Certified Nurse Midwife

## 2022-07-06 ENCOUNTER — Ambulatory Visit (INDEPENDENT_AMBULATORY_CARE_PROVIDER_SITE_OTHER): Payer: BC Managed Care – PPO | Admitting: Obstetrics & Gynecology

## 2022-07-06 ENCOUNTER — Other Ambulatory Visit: Payer: Self-pay

## 2022-07-06 ENCOUNTER — Other Ambulatory Visit (HOSPITAL_COMMUNITY)
Admission: RE | Admit: 2022-07-06 | Discharge: 2022-07-06 | Disposition: A | Discharge status: Home / Self Care | Payer: BC Managed Care – PPO | Source: Ambulatory Visit | Attending: Obstetrics & Gynecology | Admitting: Obstetrics & Gynecology

## 2022-07-06 VITALS — BP 126/86 | HR 101 | Wt 165.4 lb

## 2022-07-06 DIAGNOSIS — A6004 Herpesviral vulvovaginitis: Secondary | ICD-10-CM

## 2022-07-06 DIAGNOSIS — B3731 Acute candidiasis of vulva and vagina: Secondary | ICD-10-CM

## 2022-07-06 DIAGNOSIS — O26892 Other specified pregnancy related conditions, second trimester: Secondary | ICD-10-CM

## 2022-07-06 DIAGNOSIS — N898 Other specified noninflammatory disorders of vagina: Secondary | ICD-10-CM | POA: Insufficient documentation

## 2022-07-06 DIAGNOSIS — Z3492 Encounter for supervision of normal pregnancy, unspecified, second trimester: Secondary | ICD-10-CM

## 2022-07-06 DIAGNOSIS — Z3A22 22 weeks gestation of pregnancy: Secondary | ICD-10-CM

## 2022-07-06 DIAGNOSIS — B372 Candidiasis of skin and nail: Secondary | ICD-10-CM

## 2022-07-06 MED ORDER — NYSTATIN-TRIAMCINOLONE 100000-0.1 UNIT/GM-% EX OINT: 1.0000 | TOPICAL_OINTMENT | Freq: Two times a day (BID) | CUTANEOUS | 20 refills | Status: DC

## 2022-07-06 MED ORDER — VALACYCLOVIR HCL 1 G PO TABS: 1000.0000 mg | ORAL_TABLET | Freq: Every day | ORAL | 2 refills | Status: AC

## 2022-07-06 NOTE — Progress Notes (Signed)
PRENATAL VISIT NOTE  Subjective:  Victoria Hoffman is a 21 y.o. G1P0 at 58w0dbeing seen today for ongoing evaluation of her perineal rash which has been going on for months.  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  Patient reports  continued itchy rash in the junction between her labia and inner thighs.  It got getter with antifungal and steroid topical cream in the past, used this for two weeks . Uses regular Dove soap for cleansing area. Also reports white, itchy vaginal discharge.  History of genital HSV, no current symptoms ot signs of outbreak.  Take Valtrex as needed, but wants to do everything she can to prevent transmission to her fetus. Contractions: Not present. Vag. Bleeding: None.  Movement: Present. Denies leaking of fluid.   The following portions of the patient's history were reviewed and updated as appropriate: allergies, current medications, past family history, past medical history, past social history, past surgical history and problem list.   Objective:   Vitals:   07/06/22 0923  BP: 126/86  Pulse: (!) 101  Weight: 165 lb 6.4 oz (75 kg)    Fetal Status: Fetal Heart Rate (bpm): 138   Movement: Present     General:  Alert, oriented and cooperative. Patient is in no acute distress.  Skin: Skin is warm and dry. No rash noted.   Cardiovascular: Normal heart rate noted  Respiratory: Normal respiratory effort, no problems with respiration noted  Abdomen: Soft, gravid, appropriate for gestational age.  Pain/Pressure: Absent     Pelvic: Erythematous macular rash between lateral labia and inner thighs bilaterally. White vaginal discharge, testing sample obtained.  Performed in the presence of a chaperone        Extremities: Normal range  of motion.     Mental Status: Normal mood and affect. Normal behavior. Normal judgment and thought content.   Assessment and Plan:  Pregnancy: G1P0 at 272w0d. Vaginal discharge during pregnancy in second trimester - Cervicovaginal ancillary only done, will follow up results and manage accordingly. Proper vulvar hygiene emphasized: discussed avoidance of perfumed soaps, detergents, lotions and any type of douches; in addition to wearing cotton underwear and no underwear at night.  Also recommended cleaning front to back, voiding and cleaning up after intercourse.   2. Herpes simplex vulvovaginitis Discussed taking suppression, she feels she gets outbreak every month.  Discussed that if she has outbreak or symptoms around time of delivery, cesarean delivery would be indicated. - valACYclovir (VALTREX) 1000 MG tablet; Take 1 tablet (1,000 mg total) by mouth daily.  Dispense: 90 tablet; Refill: 2  3. Skin yeast infection Mycology prescribed, asked to use for about a month to see if that could help.   - nystatin-triamcinolone ointment (MYCOLOG); Apply 1 Application topically 2 (two) times daily.  Dispense: 60 g; Refill: 20  4. [redacted] weeks gestation of pregnancy 5. Encounter for supervision of low-risk pregnancy in second trimester No other concerns. Preterm labor symptoms and general obstetric precautions including but not limited to vaginal bleeding, contractions, leaking of fluid and fetal movement were reviewed in detail with the patient. Please refer to After Visit Summary for other counseling recommendations.   Return in about 20 days (around 07/26/2022) for OB visits as scheduled.  Future Appointments  Date Time Provider [place]11/13/2023  1:40 PM Tobb,  Kardie, DO [facility] None  07/26/2022  1:15 PM Gabriel Carina, CNM [facility]    Verita Schneiders, MD

## 2022-07-07 LAB — CERVICOVAGINAL ANCILLARY ONLY
Bacterial Vaginitis (gardnerella): NEGATIVE
Candida Glabrata: NEGATIVE
Candida Vaginitis: POSITIVE — AB
Chlamydia: NEGATIVE
Comment: NEGATIVE
Comment: NEGATIVE
Comment: NEGATIVE
Comment: NEGATIVE
Comment: NEGATIVE
Comment: NORMAL
Neisseria Gonorrhea: NEGATIVE
Trichomonas: NEGATIVE

## 2022-07-09 MED ORDER — FLUCONAZOLE 150 MG PO TABS: 150.0000 mg | ORAL_TABLET | Freq: Once | ORAL | 0 refills | Status: DC

## 2022-07-09 NOTE — Addendum Note (Signed)
Addended by: Verita Schneiders A on: 07/09/2022 04:09 PM   Modules accepted: Orders

## 2022-07-17 ENCOUNTER — Ambulatory Visit: Payer: BC Managed Care – PPO | Attending: Cardiology | Admitting: Cardiology

## 2022-07-17 ENCOUNTER — Encounter: Payer: Self-pay | Admitting: Cardiology

## 2022-07-17 VITALS — BP 120/80 | HR 92 | Ht 62.0 in | Wt 168.4 lb

## 2022-07-17 DIAGNOSIS — R0609 Other forms of dyspnea: Secondary | ICD-10-CM | POA: Diagnosis not present

## 2022-07-17 NOTE — Progress Notes (Signed)
Cardio-Obstetrics Clinic  New Evaluation  Date:  07/17/2022   ID:  Victoria Hoffman, DOB May 12, 2001, MRN [id number]  PCP:  Pcp, No   [place] Providers Cardiologist:  None  Electrophysiologist:  None       Referring MD: Gabriel Carina, CNM   Chief Complaint: " I am ok"  History of Present Illness:    Victoria Hoffman is a 21 y.o. female [G1P0] who is being seen today for the evaluation of shortness of breath and palpitations at the request of Gabriel Carina, CNM.   Medical history includes asthma comes to be evaluated for shortness of breath worsening as well as intermittent palpitation.  Patient tells me that she has been having some palpitations but her heart rate has only been going up into the close 100s.  Normal heart rate for her is in the 60s.  Since pregnancy that has increased. With spasm is the fact that she is having shortness of breath on exertion and is worsening.  No chest pain she has had some intermittent lightheadedness but not often.  Prior CV Studies Reviewed: The following studies were reviewed today: None   Past Medical History:  Diagnosis Date   Asthma    Celiac disease    HSV-1 (herpes simplex virus 1) infection     Past Surgical History:  Procedure Laterality Date   LAPAROSCOPY        OB History     Gravida  1   Para      Term      Preterm      AB      Living         SAB      IAB      Ectopic      Multiple      Live Births                  Current Medications: Current Meds  Medication Sig   Albuterol Sulfate, sensor, 108 (90 Base) MCG/ACT AEPB Inhale into the lungs.   Prenatal Vit-Fe Fumarate-FA (PRENATAL MULTIVITAMIN) TABS tablet Take 1 tablet by mouth daily at 12 noon.     Allergies:   Metronidazole and Wheat bran   Social History   Socioeconomic History   Marital status: Single    Spouse name: Not on file   Number of children: Not on file   Years of education: Not on file   Highest education  level: Not on file  Occupational History   Not on file  Tobacco Use   Smoking status: Never   Smokeless tobacco: Never  Vaping Use   Vaping Use: Never used  Substance and Sexual Activity   Alcohol use: Not Currently   Drug use: Not Currently   Sexual activity: Not Currently    Birth control/protection: None  Other Topics Concern   Not on file  Social History Narrative   Not on file   Social Determinants of Health   Financial Resource Strain: Not on file  Food Insecurity: No Food Insecurity (06/21/2022)   Hunger Vital Sign    Worried About Running Out of Food in the Last Year: Never true    Ran Out of Food in the Last Year: Never true  Transportation Needs: No Transportation Needs (06/21/2022)   PRAPARE - Hydrologist (Medical): No    Lack of Transportation (Non-Medical): No  Physical Activity: Not on file  Stress: Not on file  Social Connections: Not  on file      Family History  Problem Relation Age of Onset   Endometriosis Mother    Hyperlipidemia Mother    Hypertension Mother    Celiac disease Mother       ROS:   Please see the history of present illness.    Reports shortness of breath, intermittent palpitations and All other systems reviewed and are negative.   Labs/EKG Reviewed:    EKG:   EKG is was ordered today.  The ekg ordered today demonstrates this rhythm  Recent Labs: 06/20/2022: ALT 10; BUN <5; Creatinine, Ser 0.62; Hemoglobin 10.3; Platelets 276; Potassium 3.2; Sodium 137   Recent Lipid Panel No results found for: "CHOL", "TRIG", "HDL", "CHOLHDL", "LDLCALC", "LDLDIRECT"  Physical Exam:    VS:  BP 120/80 (BP Location: Right Arm)   Pulse 92   Ht 5' 2"  (1.575 m)   Wt 168 lb 6.4 oz (76.4 kg)   LMP 02/02/2022   SpO2 99%   BMI 30.80 kg/m     Wt Readings from Last 3 Encounters:  07/17/22 168 lb 6.4 oz (76.4 kg)  07/06/22 165 lb 6.4 oz (75 kg)  06/21/22 162 lb 14.4 oz (73.9 kg)     GEN:  Well nourished, well  developed in no acute distress HEENT: Normal NECK: No JVD; No carotid bruits LYMPHATICS: No lymphadenopathy CARDIAC: RRR, no murmurs, rubs, gallops RESPIRATORY:  Clear to auscultation without rales, wheezing or rhonchi  ABDOMEN: Soft, non-tender, non-distended MUSCULOSKELETAL:  No edema; No deformity  SKIN: Warm and dry NEUROLOGIC:  Alert and oriented x 3 PSYCHIATRIC:  Normal affect    Risk Assessment/Risk Calculators:     CARPREG II Risk Prediction Index Score:  1.  The patient's risk for a primary cardiac event is 5%.            ASSESSMENT & PLAN:    Shortness of breath Palpitation  Transient shortness of breath we will get an echocardiogram to rule out any structural abnormalities.  Shortness of breath is out of proportion therefore an echo is a good start.  For palpitations, and this appears to be her appropriate pregnancy increasing heart rate.  She is only going up into the 100s.  However for completeness of asked the patient to get a kardia mobile which will be able to monitor her heart rhythm anytime she experiences palpitations.  No other complaints at this time.  Patient Instructions  Medication Instructions:  Your physician recommends that you continue on your current medications as directed. Please refer to the Current Medication list given to you today.  *If you need a refill on your cardiac medications before your next appointment, please call your pharmacy*   Lab Work: NONE If you have labs (blood work) drawn today and your tests are completely normal, you will receive your results only by: [place] (if you have MyChart) OR A paper copy in the mail If you have any lab test that is abnormal or we need to change your treatment, we will call you to review the results.   Testing/Procedures: Your physician has requested that you have an echocardiogram. Echocardiography is a painless test that uses sound waves to create images of your heart. It provides  your doctor with information about the size and shape of your heart and how well your heart's chambers and valves are working. This procedure takes approximately one hour. There are no restrictions for this procedure. Please do NOT wear cologne, perfume, aftershave, or lotions (deodorant is allowed).  Please arrive 15 minutes prior to your appointment time.     Follow-Up: At [facility], you and your health needs are our priority.  As part of our continuing mission to provide you with exceptional heart care, we have created designated Provider Care Teams.  These Care Teams include your primary Cardiologist (physician) and Advanced Practice Providers (APPs -  Physician Assistants and Nurse Practitioners) who all work together to provide you with the care you need, when you need it.  We recommend signing up for the patient portal called "MyChart".  Sign up information is provided on this After Visit Summary.  MyChart is used to connect with patients for Virtual Visits (Telemedicine).  Patients are able to view lab/test results, encounter notes, upcoming appointments, etc.  Non-urgent messages can be sent to your provider as well.   To learn more about what you can do with MyChart, go to [email or web address].    Your next appointment:   3 month(s)  The format for your next appointment:   In Person  Provider:   Berniece Salines, DO    Other Instructions KardiaMobile [email or web address]        FDA-cleared, clinical grade mobile EKG monitor: Jodelle Red is the most clinically-validated mobile EKG used by the world's leading cardiac care medical professionals With Basic service, know instantly if your heart rhythm is normal or if atrial fibrillation is detected, and email the last single EKG recording to yourself or your doctor Premium service, available for purchase through the Kardia app for $9.99 per month or $99 per year, includes unlimited history and  storage of your EKG recordings, a monthly EKG summary report to share with your doctor, along with the ability to track your blood pressure, activity and weight Includes one KardiaMobile phone clip FREE SHIPPING: Standard delivery 1-3 business days. Orders placed by 11:00am PST will ship that afternoon. Otherwise, will ship next business day. All orders ship via [employer] from [place], [place] - sending an EKG Download app and set up profile. Run EKG - by placing 1-2 fingers on the silver plates After EKG is complete - Download PDF  - Skip password (if you apply a password the provider will need it to view the EKG) Click share button (square with upward arrow) in bottom left corner To send: choose MyChart (first time log into MyChart)  Pop up window about sending ECG Click continue Choose type of message Choose provider Type subject and message Click send (EKG should be attached)  - To send additional EKGs in one message click the paperclip image and bottom of page to attach.      Dispo:  Return in about 3 months (around 10/17/2022).   Medication Adjustments/Labs and Tests Ordered: Current medicines are reviewed at length with the patient today.  Concerns regarding medicines are outlined above.  Tests Ordered: Orders Placed This Encounter  Procedures   ECHOCARDIOGRAM COMPLETE   Medication Changes: No orders of the defined types were placed in this encounter.

## 2022-07-17 NOTE — Patient Instructions (Signed)
Medication Instructions:  Your physician recommends that you continue on your current medications as directed. Please refer to the Current Medication list given to you today.  *If you need a refill on your cardiac medications before your next appointment, please call your pharmacy*   Lab Work: NONE If you have labs (blood work) drawn today and your tests are completely normal, you will receive your results only by: [place] (if you have MyChart) OR A paper copy in the mail If you have any lab test that is abnormal or we need to change your treatment, we will call you to review the results.   Testing/Procedures: Your physician has requested that you have an echocardiogram. Echocardiography is a painless test that uses sound waves to create images of your heart. It provides your doctor with information about the size and shape of your heart and how well your heart's chambers and valves are working. This procedure takes approximately one hour. There are no restrictions for this procedure. Please do NOT wear cologne, perfume, aftershave, or lotions (deodorant is allowed). Please arrive 15 minutes prior to your appointment time.     Follow-Up: At [facility], you and your health needs are our priority.  As part of our continuing mission to provide you with exceptional heart care, we have created designated Provider Care Teams.  These Care Teams include your primary Cardiologist (physician) and Advanced Practice Providers (APPs -  Physician Assistants and Nurse Practitioners) who all work together to provide you with the care you need, when you need it.  We recommend signing up for the patient portal called "MyChart".  Sign up information is provided on this After Visit Summary.  MyChart is used to connect with patients for Virtual Visits (Telemedicine).  Patients are able to view lab/test results, encounter notes, upcoming appointments, etc.  Non-urgent messages can be sent to  your provider as well.   To learn more about what you can do with MyChart, go to [email or web address].    Your next appointment:   3 month(s)  The format for your next appointment:   In Person  Provider:   Berniece Salines, DO    Other Instructions KardiaMobile [email or web address]        FDA-cleared, clinical grade mobile EKG monitor: Victoria Hoffman is the most clinically-validated mobile EKG used by the world's leading cardiac care medical professionals With Basic service, know instantly if your heart rhythm is normal or if atrial fibrillation is detected, and email the last single EKG recording to yourself or your doctor Premium service, available for purchase through the Kardia app for $9.99 per month or $99 per year, includes unlimited history and storage of your EKG recordings, a monthly EKG summary report to share with your doctor, along with the ability to track your blood pressure, activity and weight Includes one KardiaMobile phone clip FREE SHIPPING: Standard delivery 1-3 business days. Orders placed by 11:00am PST will ship that afternoon. Otherwise, will ship next business day. All orders ship via [employer] from [place], [place] - sending an EKG Download app and set up profile. Run EKG - by placing 1-2 fingers on the silver plates After EKG is complete - Download PDF  - Skip password (if you apply a password the provider will need it to view the EKG) Click share button (square with upward arrow) in bottom left corner To send: choose MyChart (first time log into MyChart)  Pop up window about sending ECG Click continue Choose  type of message Choose provider Type subject and message Click send (EKG should be attached)  - To send additional EKGs in one message click the paperclip image and bottom of page to attach.

## 2022-07-19 NOTE — Addendum Note (Signed)
Addended by: Deanna Artis A on: 07/19/2022 09:22 AM   Modules accepted: Orders

## 2022-07-24 ENCOUNTER — Encounter (HOSPITAL_COMMUNITY): Payer: Self-pay | Admitting: Family Medicine

## 2022-07-24 ENCOUNTER — Inpatient Hospital Stay (HOSPITAL_COMMUNITY)
Admission: AD | Admit: 2022-07-24 | Discharge: 2022-07-24 | Disposition: A | Discharge status: Home / Self Care | Payer: BC Managed Care – PPO | Attending: Family Medicine | Admitting: Family Medicine

## 2022-07-24 DIAGNOSIS — Z3492 Encounter for supervision of normal pregnancy, unspecified, second trimester: Secondary | ICD-10-CM

## 2022-07-24 DIAGNOSIS — Z3A24 24 weeks gestation of pregnancy: Secondary | ICD-10-CM | POA: Insufficient documentation

## 2022-07-24 DIAGNOSIS — Z3689 Encounter for other specified antenatal screening: Secondary | ICD-10-CM | POA: Diagnosis not present

## 2022-07-24 DIAGNOSIS — O368120 Decreased fetal movements, second trimester, not applicable or unspecified: Secondary | ICD-10-CM | POA: Insufficient documentation

## 2022-07-24 NOTE — MAU Provider Note (Signed)
Event Date/Time   First Provider Initiated Contact with Patient 07/24/22 2210     S Victoria Hoffman is a 21 y.o. G1P0 pregnant female at 74w4dwho presents to MAU today with complaint of decreased fetal movement since 2pm. No other physical complaints.  [facility]. Has an anterior placenta.  Pertinent items noted in HPI and remainder of comprehensive ROS otherwise negative.   O BP (!) 128/55   Pulse 80   Temp 98 F (36.7 C) (Oral)   Resp 17   Ht 5' 2"  (1.575 m)   Wt 170 lb (77.1 kg)   LMP 02/02/2022   SpO2 100%   BMI 31.09 kg/m  Physical Exam Vitals and nursing note reviewed.  Constitutional:      Appearance: Normal appearance.  HENT:     Head: Normocephalic and atraumatic.  Eyes:     Pupils: Pupils are equal, round, and reactive to light.  Cardiovascular:     Rate and Rhythm: Normal rate and regular rhythm.  Pulmonary:     Effort: Pulmonary effort is normal.  Abdominal:     Palpations: Abdomen is soft.  Musculoskeletal:        General: Normal range of motion.     Cervical back: Normal range of motion.  Skin:    General: Skin is warm and dry.     Capillary Refill: Capillary refill takes less than 2 seconds.  Neurological:     Mental Status: She is alert and oriented to person, place, and time.  Psychiatric:        Mood and Affect: Mood normal.        Behavior: Behavior normal.        Thought Content: Thought content normal.        Judgment: Judgment normal.   Fetal Tracing: reactive Baseline: 135 Variability: moderate Accelerations: 10x10 (appropriate for gestational age) Decelerations: none Toco: relaxed   MDM/MAU Course: Fetal movement immediately noted upon placement of monitors, felt and heard throughout provider visit. Education provided about expected fetal movement in the 2nd trimester and how an anterior placenta can change perception of movement. Pt expressed relief.  A Fetal movement present, second  trimester NST reactive [redacted] weeks gestation of pregnancy   P Discharge from MAU in stable condition with 2nd trimester precautions [facility]  Allergies as of 07/24/2022       Reactions   Metronidazole Nausea And Vomiting   Wheat Bran Other (See Comments), Rash        Medication List     STOP taking these medications    clotrimazole-betamethasone cream Commonly known as: LOTRISONE   famotidine 10 MG tablet Commonly known as: PEPCID   fexofenadine 60 MG tablet Commonly known as: ALLEGRA   nystatin-triamcinolone ointment Commonly known as: MYCOLOG       TAKE these medications    Albuterol Sulfate (sensor) 108 (90 Base) MCG/ACT Aepb Inhale into the lungs.   busPIRone 7.5 MG tablet Commonly known as: BUSPAR Take by mouth.   prenatal multivitamin Tabs tablet Take 1 tablet by mouth daily at 12 noon.   valACYclovir 1000 MG tablet Commonly known as: Valtrex Take 1 tablet (1,000 mg total) by mouth daily.       WGabriel Carina CNorth [place]11/20/2023 10:15 PM

## 2022-07-24 NOTE — MAU Note (Signed)
DFM since 2pm today, no other c/o. Uneventful PNC

## 2022-07-24 NOTE — MAU Provider Note (Incomplete)
Event Date/Time   First Provider Initiated Contact with Patient 07/24/22 2210     S Ms. Deijah Spikes is a 21 y.o. G1P0 pregnant female at 51w4dwho presents to MAU today with complaint of ***.   Receives care at ***. Prenatal records reviewed.  Pertinent items noted in HPI and remainder of comprehensive ROS otherwise negative.   O BP (!) 128/55   Pulse 80   Temp 98 F (36.7 C) (Oral)   Resp 17   Ht 5' 2"  (1.575 m)   Wt 170 lb (77.1 kg)   LMP 02/02/2022   SpO2 100%   BMI 31.09 kg/m  Physical Exam   MDM/MAU Course:  A @DX @ Medical screening exam {Blank single:19197::"complete","started"}  P Discharge from MAU in stable condition with *** precautions Follow up at *** as scheduled for ongoing prenatal care  Allergies as of 07/24/2022       Reactions   Metronidazole Nausea And Vomiting   Wheat Bran Other (See Comments), Rash        Medication List     STOP taking these medications    clotrimazole-betamethasone cream Commonly known as: LOTRISONE   famotidine 10 MG tablet Commonly known as: PEPCID   fexofenadine 60 MG tablet Commonly known as: ALLEGRA   nystatin-triamcinolone ointment Commonly known as: MYCOLOG       TAKE these medications    Albuterol Sulfate (sensor) 108 (90 Base) MCG/ACT Aepb Inhale into the lungs.   busPIRone 7.5 MG tablet Commonly known as: BUSPAR Take by mouth.   prenatal multivitamin Tabs tablet Take 1 tablet by mouth daily at 12 noon.   valACYclovir 1000 MG tablet Commonly known as: Valtrex Take 1 tablet (1,000 mg total) by mouth daily.        WGabriel Carina CNorth [place]11/20/2023 10:15 PM

## 2022-07-26 ENCOUNTER — Ambulatory Visit (INDEPENDENT_AMBULATORY_CARE_PROVIDER_SITE_OTHER): Payer: BC Managed Care – PPO | Admitting: Certified Nurse Midwife

## 2022-07-26 ENCOUNTER — Other Ambulatory Visit: Payer: Self-pay

## 2022-07-26 VITALS — BP 132/88 | HR 106 | Wt 173.0 lb

## 2022-07-26 DIAGNOSIS — Z3A25 25 weeks gestation of pregnancy: Secondary | ICD-10-CM | POA: Diagnosis not present

## 2022-07-26 DIAGNOSIS — Z3492 Encounter for supervision of normal pregnancy, unspecified, second trimester: Secondary | ICD-10-CM | POA: Diagnosis not present

## 2022-07-26 DIAGNOSIS — Z3A24 24 weeks gestation of pregnancy: Secondary | ICD-10-CM

## 2022-07-27 NOTE — Progress Notes (Signed)
   PRENATAL VISIT NOTE  Subjective:  Victoria Hoffman is a 21 y.o. G1P0 at 34w0dbeing seen today for ongoing prenatal care.  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  Patient reports no complaints.  Contractions: Not present. Vag. Bleeding: None.  Movement: Present. Denies leaking of fluid.   The following portions of the patient's history were reviewed and updated as appropriate: allergies, current medications, past family history, past medical history, past social history, past surgical history and problem list.   Objective:   Vitals:   07/26/22 1337  BP: 132/88  Pulse: (!) 106  Weight: 173 lb (78.5 kg)    Fetal Status: Fetal Heart Rate (bpm): 143 Fundal Height: 24 cm Movement: Present     General:  Alert, oriented and cooperative. Patient is in no acute distress.  Skin: Skin is warm and dry. No rash noted.   Cardiovascular: Normal heart rate noted  Respiratory: Normal respiratory effort, no problems with respiration noted  Abdomen: Soft, gravid, appropriate for gestational age.  Pain/Pressure: Absent     Pelvic: Cervical exam deferred        Extremities: Normal range of motion.  Edema: Trace  Mental Status: Normal mood and affect. Normal behavior. Normal judgment and thought content.   Assessment and Plan:  Pregnancy: G1P0 at 290w0d. Encounter for supervision of low-risk pregnancy in second trimester - Doing well, feeling regular and vigorous fetal movement   2. [redacted] weeks gestation of pregnancy - Routine OB care including anticipatory guidance re GTT at next visit  Preterm labor symptoms and general obstetric precautions including but not limited to vaginal bleeding, contractions, leaking of fluid and fetal movement were reviewed in  detail with the patient. Please refer to After Visit Summary for other counseling recommendations.   Return in about 2 weeks (around 08/09/2022) for IN-PERSON, LOB/GTT.  Future Appointments  Date Time Provider [place]12/07/2022  8:15 AM CrConcepcion LivingMD [facility]2022  8:50 AM [facility]12/14/2023  9:35 AM [facility] 5 [facility]  10/19/2022  9:40 AM Tobb, KaGodfrey PickDO [facility] None    JaGabriel CarinaCNM

## 2022-07-31 ENCOUNTER — Encounter: Payer: Self-pay | Admitting: *Deleted

## 2022-08-09 ENCOUNTER — Other Ambulatory Visit: Payer: Self-pay

## 2022-08-09 DIAGNOSIS — Z3492 Encounter for supervision of normal pregnancy, unspecified, second trimester: Secondary | ICD-10-CM

## 2022-08-14 ENCOUNTER — Encounter: Payer: BC Managed Care – PPO | Admitting: Family Medicine

## 2022-08-14 ENCOUNTER — Other Ambulatory Visit: Payer: BC Managed Care – PPO

## 2022-08-16 ENCOUNTER — Ambulatory Visit (INDEPENDENT_AMBULATORY_CARE_PROVIDER_SITE_OTHER): Payer: BC Managed Care – PPO | Admitting: Certified Nurse Midwife

## 2022-08-16 ENCOUNTER — Other Ambulatory Visit: Payer: BC Managed Care – PPO

## 2022-08-16 ENCOUNTER — Other Ambulatory Visit: Payer: Self-pay

## 2022-08-16 VITALS — BP 121/71 | HR 89 | Wt 178.7 lb

## 2022-08-16 DIAGNOSIS — Z3492 Encounter for supervision of normal pregnancy, unspecified, second trimester: Secondary | ICD-10-CM

## 2022-08-16 DIAGNOSIS — Z3A27 27 weeks gestation of pregnancy: Secondary | ICD-10-CM

## 2022-08-16 NOTE — Progress Notes (Signed)
   PRENATAL VISIT NOTE  Subjective:  Victoria Hoffman is a 21 y.o. G1P0 at 80w6dbeing seen today for ongoing prenatal care.  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  Patient reports no complaints.  Contractions: Not present. Vag. Bleeding: None.  Movement: Present. Denies leaking of fluid.   The following portions of the patient's history were reviewed and updated as appropriate: allergies, current medications, past family history, past medical history, past social history, past surgical history and problem list.   Objective:   Vitals:   08/16/22 0855  BP: 121/71  Pulse: 89  Weight: 178 lb 11.2 oz (81.1 kg)    Fetal Status: Fetal Heart Rate (bpm): 140 Fundal Height: 28 cm Movement: Present     General:  Alert, oriented and cooperative. Patient is in no acute distress.  Skin: Skin is warm and dry. No rash noted.   Cardiovascular: Normal heart rate noted  Respiratory: Normal respiratory effort, no problems with respiration noted  Abdomen: Soft, gravid, appropriate for gestational age.  Pain/Pressure: Absent     Pelvic: Cervical exam deferred        Extremities: Normal range of motion.  Edema: None  Mental Status: Normal mood and affect. Normal behavior. Normal judgment and thought content.   Assessment and Plan:  Pregnancy: G1P0 at 275w6d. Encounter for supervision of low-risk pregnancy in second trimester - Doing well, feeling regular and vigorous fetal movement   2. [redacted] weeks gestation of pregnancy - Routine OB care including 3rd trimester labs and GTT - Requests to get Tdap at next visit.  Preterm labor symptoms and general obstetric precautions including but not limited to vaginal bleeding, contractions, leaking of fluid and fetal  movement were reviewed in detail with the patient. Please refer to After Visit Summary for other counseling recommendations.   Return in about 2 weeks (around 08/30/2022) for IN-PERSON, LOB.  Future Appointments  Date Time Provider [place]12/14/2023  9:35 AM [facility]  09/06/2022 10:15 AM WaGabriel CarinaCNM [facility]1/24/2024 10:15 AM WaGabriel CarinaCNM [facility]2/15/2024  9:40 AM Tobb, KaGodfrey PickDO [facility] None    JaGabriel CarinaCNM

## 2022-08-17 ENCOUNTER — Ambulatory Visit (HOSPITAL_COMMUNITY): Payer: BC Managed Care – PPO | Attending: Cardiology

## 2022-08-17 DIAGNOSIS — I3139 Other pericardial effusion (noninflammatory): Secondary | ICD-10-CM | POA: Insufficient documentation

## 2022-08-17 DIAGNOSIS — R609 Edema, unspecified: Secondary | ICD-10-CM | POA: Insufficient documentation

## 2022-08-17 DIAGNOSIS — R002 Palpitations: Secondary | ICD-10-CM | POA: Insufficient documentation

## 2022-08-17 DIAGNOSIS — R0609 Other forms of dyspnea: Secondary | ICD-10-CM

## 2022-08-17 DIAGNOSIS — J45909 Unspecified asthma, uncomplicated: Secondary | ICD-10-CM | POA: Diagnosis not present

## 2022-08-17 DIAGNOSIS — R0600 Dyspnea, unspecified: Secondary | ICD-10-CM | POA: Insufficient documentation

## 2022-08-17 DIAGNOSIS — Z3A28 28 weeks gestation of pregnancy: Secondary | ICD-10-CM | POA: Diagnosis not present

## 2022-08-17 LAB — RPR: RPR Ser Ql: NONREACTIVE

## 2022-08-17 LAB — GLUCOSE TOLERANCE, 2 HOURS W/ 1HR
Glucose, 1 hour: 89 mg/dL (ref 70–179)
Glucose, 2 hour: 73 mg/dL (ref 70–152)
Glucose, Fasting: 69 mg/dL — ABNORMAL LOW (ref 70–91)

## 2022-08-17 LAB — ECHOCARDIOGRAM COMPLETE
Area-P 1/2: 4.16 cm2
S' Lateral: 2.9 cm

## 2022-08-17 LAB — CBC
Hematocrit: 31.4 % — ABNORMAL LOW (ref 34.0–46.6)
Hemoglobin: 10.3 g/dL — ABNORMAL LOW (ref 11.1–15.9)
MCH: 29.7 pg (ref 26.6–33.0)
MCHC: 32.8 g/dL (ref 31.5–35.7)
MCV: 91 fL (ref 79–97)
Platelets: 267 10*3/uL (ref 150–450)
RBC: 3.47 x10E6/uL — ABNORMAL LOW (ref 3.77–5.28)
RDW: 13.5 % (ref 11.7–15.4)
WBC: 11.6 10*3/uL — ABNORMAL HIGH (ref 3.4–10.8)

## 2022-08-17 LAB — HIV ANTIBODY (ROUTINE TESTING W REFLEX): HIV Screen 4th Generation wRfx: NONREACTIVE

## 2022-08-23 ENCOUNTER — Encounter (HOSPITAL_COMMUNITY): Payer: Self-pay | Admitting: Obstetrics and Gynecology

## 2022-08-23 ENCOUNTER — Inpatient Hospital Stay (HOSPITAL_COMMUNITY)
Admission: AD | Admit: 2022-08-23 | Discharge: 2022-08-23 | Disposition: A | Discharge status: Home / Self Care | Payer: BC Managed Care – PPO | Attending: Obstetrics and Gynecology | Admitting: Obstetrics and Gynecology

## 2022-08-23 DIAGNOSIS — O4703 False labor before 37 completed weeks of gestation, third trimester: Secondary | ICD-10-CM | POA: Insufficient documentation

## 2022-08-23 DIAGNOSIS — O479 False labor, unspecified: Secondary | ICD-10-CM | POA: Diagnosis not present

## 2022-08-23 DIAGNOSIS — Z3492 Encounter for supervision of normal pregnancy, unspecified, second trimester: Secondary | ICD-10-CM

## 2022-08-23 DIAGNOSIS — N898 Other specified noninflammatory disorders of vagina: Secondary | ICD-10-CM | POA: Insufficient documentation

## 2022-08-23 DIAGNOSIS — Z3A28 28 weeks gestation of pregnancy: Secondary | ICD-10-CM | POA: Diagnosis not present

## 2022-08-23 DIAGNOSIS — B009 Herpesviral infection, unspecified: Secondary | ICD-10-CM | POA: Insufficient documentation

## 2022-08-23 DIAGNOSIS — Z79624 Long term (current) use of inhibitors of nucleotide synthesis: Secondary | ICD-10-CM | POA: Insufficient documentation

## 2022-08-23 DIAGNOSIS — O98513 Other viral diseases complicating pregnancy, third trimester: Secondary | ICD-10-CM | POA: Insufficient documentation

## 2022-08-23 DIAGNOSIS — O26893 Other specified pregnancy related conditions, third trimester: Secondary | ICD-10-CM | POA: Diagnosis present

## 2022-08-23 LAB — URINALYSIS, ROUTINE W REFLEX MICROSCOPIC
Bilirubin Urine: NEGATIVE
Glucose, UA: NEGATIVE mg/dL
Hgb urine dipstick: NEGATIVE
Ketones, ur: NEGATIVE mg/dL
Leukocytes,Ua: NEGATIVE
Nitrite: NEGATIVE
Protein, ur: NEGATIVE mg/dL
Specific Gravity, Urine: 1.002 — ABNORMAL LOW (ref 1.005–1.030)
pH: 7 (ref 5.0–8.0)

## 2022-08-23 LAB — WET PREP, GENITAL
Clue Cells Wet Prep HPF POC: NONE SEEN
Sperm: NONE SEEN
Trich, Wet Prep: NONE SEEN
WBC, Wet Prep HPF POC: >=10 — AB (ref <10)
Yeast Wet Prep HPF POC: NONE SEEN

## 2022-08-23 NOTE — MAU Note (Signed)
Pt says she called nurse line Yesterday-  she had brown blood streaks in underwear  And felt UC's This am- no blood in clothes No UC's  First [facility]- clinic Has hx of HSV- last outbreak - Sept - Oct  Takes Valtrex daily  Last sex- Aug

## 2022-08-23 NOTE — MAU Provider Note (Signed)
Chief Complaint:  Vaginal Bleeding   Event Date/Time   First Provider Initiated Contact with Patient 08/23/22 0615     HPI: Victoria Hoffman is a 21 y.o. G1P0 at 36w6dho presents to maternity admissions reporting painless contractions yesterday, none today.  Also had several streaks of brown discharge.  None today. No recent intercourse. . She reports good fetal movement, denies LOF, urinary symptoms, h/a, dizziness, n/v, diarrhea, constipation or fever/chills.    Vaginal Bleeding The patient's primary symptoms include vaginal bleeding (brown yesterday, none now). The patient's pertinent negatives include no genital itching, genital odor or pelvic pain. This is a new problem. The current episode started yesterday. The problem has been resolved. The patient is experiencing no pain. She is pregnant. Pertinent negatives include no abdominal pain, back pain, fever, frequency, nausea or vomiting. The vaginal discharge was brown. She has not been passing clots. She has not been passing tissue. Nothing aggravates the symptoms. She has tried nothing for the symptoms.    RN Note: Pt says she called nurse line Yesterday-  she had brown blood streaks in underwear  And felt UC's This am- no blood in clothes No UC's  [facility] hx of HSV- last outbreak - Sept - Oct  Takes Valtrex daily  Last sex- Aug   Past Medical History: Past Medical History:  Diagnosis Date   Asthma    Celiac disease    HSV-1 (herpes simplex virus 1) infection     Past obstetric history: OB History  Gravida Para Term Preterm AB Living  1            SAB IAB Ectopic Multiple Live Births               # Outcome Date GA Lbr Len/2nd Weight Sex Delivery Anes PTL Lv  1 Current             Past Surgical History: Past Surgical History:  Procedure Laterality Date   LAPAROSCOPY      Family History: Family History  Problem Relation Age of Onset   Endometriosis Mother    Hyperlipidemia Mother    Hypertension Mother     Celiac disease Mother     Social History: Social History   Tobacco Use   Smoking status: Never   Smokeless tobacco: Never  Vaping Use   Vaping Use: Never used  Substance Use Topics   Alcohol use: Not Currently   Drug use: Not Currently    Allergies:  Allergies  Allergen Reactions   Metronidazole Nausea And Vomiting   Wheat Bran Other (See Comments) and Rash    Meds:  Medications Prior to Admission  Medication Sig Dispense Refill Last Dose   Prenatal Vit-Fe Fumarate-FA (PRENATAL MULTIVITAMIN) TABS tablet Take 1 tablet by mouth daily at 12 noon.   08/22/2022   valACYclovir (VALTREX) 1000 MG tablet Take 1 tablet (1,000 mg total) by mouth daily. 90 tablet 2 08/22/2022   Albuterol Sulfate, sensor, 108 (90 Base) MCG/ACT AEPB Inhale into the lungs.   Unknown   busPIRone (BUSPAR) 7.5 MG tablet Take by mouth. (Patient not taking: Reported on 07/17/2022)       I have reviewed patient's Past Medical Hx, Surgical Hx, Family Hx, Social Hx, medications and allergies.   ROS:  Review of Systems  Constitutional:  Negative for fever.  Gastrointestinal:  Negative for abdominal pain, nausea and vomiting.  Genitourinary:  Positive for vaginal bleeding. Negative for frequency and pelvic pain.  Musculoskeletal:  Negative for back pain.  Other systems negative  Physical Exam  Patient Vitals for the past 24 hrs:  BP Temp Temp src Pulse Resp Height Weight  08/23/22 0538 125/69 97.9 F (36.6 C) Oral 87 18 5' 2"  (1.575 m) 84 kg   Constitutional: Well-developed, well-nourished female in no acute distress.  Cardiovascular: normal rate and rhythm Respiratory: normal effort GI: Abd soft, non-tender, gravid appropriate for gestational age.   No rebound or guarding. MS: Extremities nontender, no edema, normal ROM Neurologic: Alert and oriented x 4.  GU: Neg CVAT.  PELVIC EXAM: Cervix pink, visually closed, without lesion, scant white creamy discharge, vaginal walls and external genitalia  normal  Cervix long and closed  FHT:  Baseline 130 , moderate variability, accelerations present, no decelerations Contractions: none   Labs: Results for orders placed or performed during the hospital encounter of 08/23/22 (from the past 24 hour(s))  Urinalysis, Routine w reflex microscopic Urine, Clean Catch     Status: Abnormal   Collection Time: 08/23/22  6:03 AM  Result Value Ref Range   Color, Urine STRAW (A) YELLOW   APPearance CLEAR CLEAR   Specific Gravity, Urine 1.002 (L) 1.005 - 1.030   pH 7.0 5.0 - 8.0   Glucose, UA NEGATIVE NEGATIVE mg/dL   Hgb urine dipstick NEGATIVE NEGATIVE   Bilirubin Urine NEGATIVE NEGATIVE   Ketones, ur NEGATIVE NEGATIVE mg/dL   Protein, ur NEGATIVE NEGATIVE mg/dL   Nitrite NEGATIVE NEGATIVE   Leukocytes,Ua NEGATIVE NEGATIVE  Wet prep, genital     Status: Abnormal   Collection Time: 08/23/22  6:26 AM  Result Value Ref Range   Yeast Wet Prep HPF POC NONE SEEN NONE SEEN   Trich, Wet Prep NONE SEEN NONE SEEN   Clue Cells Wet Prep HPF POC NONE SEEN NONE SEEN   WBC, Wet Prep HPF POC >=10 (A) <10   Sperm NONE SEEN        Imaging:    MAU Course/MDM: I have reviewed the triage vital signs and the nursing notes.   Pertinent labs & imaging results that were available during my care of the patient were reviewed by me and considered in my medical decision making (see chart for details).      I have reviewed her medical records including past results, notes and treatments.   I have ordered labs and reviewed results. UA is clear, Wet prep negative NST reviewed, reassuring for gestational age  Treatments in MAU included Speculum exam, EFM.    Assessment: Single IUP at 38w6dBraxton Hicks contractions, resolved Brown vaginal discharge, resolved, unclear etiology  Plan: Discharge home Preterm Labor precautions and fetal kick counts Follow up in Office for prenatal visits and recheck Encouraged to return if she develops worsening of symptoms,  increase in pain, fever, or other concerning symptoms.   Pt stable at time of discharge.  MHansel FeinsteinCNM, MSN Certified Nurse-Midwife 08/23/2022 6:15 AM

## 2022-08-24 LAB — GC/CHLAMYDIA PROBE AMP (~~LOC~~) NOT AT ARMC
Chlamydia: NEGATIVE
Comment: NEGATIVE
Comment: NORMAL
Neisseria Gonorrhea: NEGATIVE

## 2022-09-06 ENCOUNTER — Ambulatory Visit (INDEPENDENT_AMBULATORY_CARE_PROVIDER_SITE_OTHER): Payer: BC Managed Care – PPO | Admitting: Certified Nurse Midwife

## 2022-09-06 ENCOUNTER — Other Ambulatory Visit: Payer: Self-pay

## 2022-09-06 VITALS — BP 142/83 | HR 98 | Wt 186.0 lb

## 2022-09-06 DIAGNOSIS — F5104 Psychophysiologic insomnia: Secondary | ICD-10-CM

## 2022-09-06 DIAGNOSIS — Z23 Encounter for immunization: Secondary | ICD-10-CM

## 2022-09-06 DIAGNOSIS — Z3A31 31 weeks gestation of pregnancy: Secondary | ICD-10-CM | POA: Diagnosis not present

## 2022-09-06 DIAGNOSIS — Z3493 Encounter for supervision of normal pregnancy, unspecified, third trimester: Secondary | ICD-10-CM

## 2022-09-06 DIAGNOSIS — A6004 Herpesviral vulvovaginitis: Secondary | ICD-10-CM

## 2022-09-06 DIAGNOSIS — F419 Anxiety disorder, unspecified: Secondary | ICD-10-CM

## 2022-09-06 MED ORDER — MAG-OXIDE 200 MG PO TABS: 400.0000 mg | ORAL_TABLET | Freq: Every day | ORAL | 3 refills | Status: DC

## 2022-09-06 MED ORDER — SERTRALINE HCL 25 MG PO TABS: 25.0000 mg | ORAL_TABLET | Freq: Every day | ORAL | 11 refills | Status: AC

## 2022-09-07 NOTE — Progress Notes (Signed)
PRENATAL VISIT NOTE  Subjective:  Victoria Hoffman is a 22 y.o. G1P0 at [redacted]w[redacted]d being seen today for ongoing prenatal care.  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  Patient reports  feeling like she needs to be back on an antidepressant/anxiolytic - she was taking Buspar prior to pregnancy, did not feel like it did quite enough but can feel the depression/anxiety creeping back in. Manageable currently but wants to avoid PPD. 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  Contractions: Irritability. Vag. Bleeding: None.  Movement: Present. Denies leaking of fluid.   The following portions of the patient's history were reviewed and updated as appropriate: allergies, current medications, past family history, past medical history, past social history, past surgical history and problem list.   Objective:   Vitals:   09/06/22 1023 09/06/22 1025  BP:  (!) 142/83  Pulse:  98  Weight: 186 lb (84.4 kg) 186 lb (84.4 kg)   Fetal Status: Fetal Heart Rate (bpm): 136 Fundal Height: 31 cm Movement: Present     General:  Alert, oriented and cooperative. Patient is in no acute distress.  Skin: Skin is warm and dry. No rash noted.   Cardiovascular: Normal heart rate noted  Respiratory: Normal respiratory effort, no problems with respiration noted  Abdomen: Soft, gravid, appropriate for gestational age.  Pain/Pressure: Present     Pelvic: Cervical exam deferred        Extremities: Normal range of motion.  Edema: Trace  Mental Status: Normal mood and affect. Normal behavior. Normal judgment and thought content.   Assessment and Plan:  Pregnancy: G1P0 at [redacted]w[redacted]d 1. Encounter for supervision of low-risk  pregnancy in third trimester - Doing well overall, feeling regular and vigorous fetal movement   2. [redacted] weeks gestation of pregnancy - Routine OB care  - Tdap vaccine greater than or equal to 7yo IM  3. Herpes simplex vulvovaginitis - Taking Valtrex, no s/sx breakouts  4. Anxiety - Discussed options, amenable to trying sertaline - sertraline (ZOLOFT) 25 MG tablet; Take 1 tablet (25 mg total) by mouth daily.  Dispense: 30 tablet; Refill: 11  5. Psychophysiological insomnia - Having trouble sleeping, can't get comfortable or calm her mind down. Advised use of compression socks/maternity belt at work + salt scrub after work in a shower and then use of a heated blanket to help her relax. Zoloft and magnesium at bedtime should also help. - Magnesium Oxide -Mg Supplement (MAG-OXIDE) 200 MG TABS; Take 2 tablets (400 mg total) by mouth at bedtime. If that amount causes loose stools in the am, switch to 200mg  daily at bedtime.  Dispense: 60 tablet; Refill: 3  Preterm labor symptoms and general obstetric precautions including but not limited to vaginal bleeding, contractions, leaking of fluid and fetal movement were reviewed in detail with the patient. Please refer to After Visit Summary for other counseling recommendations.   Return for IN-PERSON, LOB.  Future Appointments  Date Time Provider [place]  09/27/2022 10:15 AM Helaine Chess [facility]  10/10/2022  9:35 AM Tresea Mall, CNM [facility]  10/18/2022  9:15 AM Gabriel Carina, CNM [facility] [place]  10/19/2022  9:40 AM Berniece Salines, DO [facility] None  10/25/2022  9:15 AM Gaylan Gerold  Lynett Fish [facility]  11/01/2022  9:15 AM Gabriel Carina, CNM [facility]    Gabriel Carina, CNM

## 2022-09-15 ENCOUNTER — Encounter (HOSPITAL_COMMUNITY): Payer: Self-pay | Admitting: Obstetrics and Gynecology

## 2022-09-15 ENCOUNTER — Inpatient Hospital Stay (HOSPITAL_COMMUNITY)
Admission: AD | Admit: 2022-09-15 | Discharge: 2022-09-15 | Disposition: A | Discharge status: Home / Self Care | Payer: BC Managed Care – PPO | Attending: Obstetrics and Gynecology | Admitting: Obstetrics and Gynecology

## 2022-09-15 ENCOUNTER — Other Ambulatory Visit: Payer: Self-pay

## 2022-09-15 DIAGNOSIS — O99891 Other specified diseases and conditions complicating pregnancy: Secondary | ICD-10-CM | POA: Diagnosis not present

## 2022-09-15 DIAGNOSIS — M545 Low back pain, unspecified: Secondary | ICD-10-CM | POA: Diagnosis not present

## 2022-09-15 DIAGNOSIS — Y939 Activity, unspecified: Secondary | ICD-10-CM | POA: Diagnosis not present

## 2022-09-15 DIAGNOSIS — R102 Pelvic and perineal pain: Secondary | ICD-10-CM | POA: Diagnosis not present

## 2022-09-15 DIAGNOSIS — M549 Dorsalgia, unspecified: Secondary | ICD-10-CM | POA: Diagnosis not present

## 2022-09-15 DIAGNOSIS — Y92410 Unspecified street and highway as the place of occurrence of the external cause: Secondary | ICD-10-CM | POA: Diagnosis not present

## 2022-09-15 DIAGNOSIS — O26893 Other specified pregnancy related conditions, third trimester: Secondary | ICD-10-CM | POA: Diagnosis present

## 2022-09-15 DIAGNOSIS — Y929 Unspecified place or not applicable: Secondary | ICD-10-CM | POA: Insufficient documentation

## 2022-09-15 DIAGNOSIS — Z3A32 32 weeks gestation of pregnancy: Secondary | ICD-10-CM | POA: Diagnosis not present

## 2022-09-15 LAB — URINALYSIS, ROUTINE W REFLEX MICROSCOPIC
Bilirubin Urine: NEGATIVE
Glucose, UA: NEGATIVE mg/dL
Hgb urine dipstick: NEGATIVE
Ketones, ur: NEGATIVE mg/dL
Nitrite: NEGATIVE
Protein, ur: NEGATIVE mg/dL
Specific Gravity, Urine: 1.008 (ref 1.005–1.030)
pH: 7 (ref 5.0–8.0)

## 2022-09-15 MED ORDER — CYCLOBENZAPRINE HCL 10 MG PO TABS: 10.0000 mg | ORAL_TABLET | Freq: Two times a day (BID) | ORAL | 0 refills | Status: DC | PRN

## 2022-09-15 MED ORDER — ACETAMINOPHEN 500 MG PO TABS
1000.0000 mg | ORAL_TABLET | Freq: Once | ORAL | Status: AC
  Administered 2022-09-15: 1000 mg via ORAL
  Filled 2022-09-15: qty 2
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
## 2022-09-15 NOTE — Progress Notes (Signed)
Alert received through NCNotify system as follows: Patient identified as having high BP reading (142/83) on 09/06/2022.  Chart review: No blood pressure follow up documented.  Action needed: Previous BP was reviewed by provider. Patient blood pressure will be rechecked at next appointment on 01/24.  Martina Sinner, RN 09/15/2022  11:56 AM

## 2022-09-16 LAB — CULTURE, OB URINE: Culture: <10000 — AB

## 2022-09-27 ENCOUNTER — Ambulatory Visit (INDEPENDENT_AMBULATORY_CARE_PROVIDER_SITE_OTHER): Payer: BC Managed Care – PPO | Admitting: Certified Nurse Midwife

## 2022-09-27 ENCOUNTER — Other Ambulatory Visit: Payer: Self-pay

## 2022-09-27 VITALS — BP 106/70 | HR 98 | Wt 184.5 lb

## 2022-09-27 DIAGNOSIS — Z3A33 33 weeks gestation of pregnancy: Secondary | ICD-10-CM

## 2022-09-27 DIAGNOSIS — Z3493 Encounter for supervision of normal pregnancy, unspecified, third trimester: Secondary | ICD-10-CM

## 2022-09-27 NOTE — Progress Notes (Signed)
   PRENATAL VISIT NOTE  Subjective:  Victoria Hoffman is a 22 y.o. G1P0 at [redacted]w[redacted]d being seen today for ongoing prenatal care.  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  Patient reports no complaints.  Contractions: Irritability. Vag. Bleeding: None.  Movement: Present. Denies leaking of fluid.   The following portions of the patient's history were reviewed and updated as appropriate: allergies, current medications, past family history, past medical history, past social history, past surgical history and problem list.   Objective:   Vitals:   09/27/22 1038  BP: 106/70  Pulse: 98  Weight: 184 lb 8 oz (83.7 kg)   Fetal Status: Fetal Heart Rate (bpm): 140 Fundal Height: 34 cm Movement: Present     General:  Alert, oriented and cooperative. Patient is in no acute distress.  Skin: Skin is warm and dry. No rash noted.   Cardiovascular: Normal heart rate noted  Respiratory: Normal respiratory effort, no problems with respiration noted  Abdomen: Soft, gravid, appropriate for gestational age.  Pain/Pressure: Present     Pelvic: Cervical exam deferred        Extremities: Normal range of motion.  Edema: None  Mental Status: Normal mood and affect. Normal behavior. Normal judgment and thought content.   Assessment and Plan:  Pregnancy: G1P0 at [redacted]w[redacted]d 1. Encounter for supervision of low-risk pregnancy in third trimester - Doing well, feeling regular and vigorous fetal movement   2. [redacted] weeks gestation of pregnancy - Routine OB care including anticipatory guidance re GBS testing at next visit  Preterm labor symptoms and general obstetric precautions including but not limited to vaginal bleeding, contractions, leaking of fluid and fetal movement were reviewed in  detail with the patient. Please refer to After Visit Summary for other counseling recommendations.   Return in about 2 weeks (around 10/11/2022) for IN-PERSON, LOB/GBS.  Future Appointments  Date Time Provider [place]  10/10/2022  9:35 AM Tresea Mall, CNM [facility]  10/18/2022  9:15 AM Gabriel Carina, CNM [facility]  10/19/2022  9:40 AM Berniece Salines, DO [facility] None  10/25/2022  9:15 AM Gabriel Carina, CNM [facility]  11/01/2022  9:15 AM Gabriel Carina, CNM [facility]  11/08/2022  2:35 PM Helaine Chess [facility]  11/15/2022  9:15 AM Helaine Chess [facility]  11/15/2022 10:15 AM [facility] N[facility] [place]    Gabriel Carina, CNM

## 2022-10-02 ENCOUNTER — Telehealth: Payer: Self-pay | Admitting: Family Medicine

## 2022-10-02 NOTE — Telephone Encounter (Signed)
Patient called in stating she thinks she has a UTI, she doesn't think its the baby sitting on her bladder because when she had a UTI before she felt the same way.

## 2022-10-02 NOTE — Telephone Encounter (Signed)
Called pt and discussed her concern. She has been having painful urination - especially when her bladder is full. She also has some frequency and is concerned for UTI. Pt acknowledges that this can also be due to pressure on her bladder form baby. I offered her a nurse visit tomorrow in order to check urinalysis. She declined and stated that she has a conflict. She opted to wait until her next office appt on 2/6 and will discuss with provider. I advised that she should call back or go to MAU if she symptoms worsen. Pt voiced understanding.

## 2022-10-10 ENCOUNTER — Other Ambulatory Visit (HOSPITAL_COMMUNITY)
Admission: RE | Admit: 2022-10-10 | Discharge: 2022-10-10 | Disposition: A | Discharge status: Home / Self Care | Payer: BC Managed Care – PPO | Source: Ambulatory Visit | Attending: Advanced Practice Midwife | Admitting: Advanced Practice Midwife

## 2022-10-10 ENCOUNTER — Ambulatory Visit (INDEPENDENT_AMBULATORY_CARE_PROVIDER_SITE_OTHER): Payer: BC Managed Care – PPO | Admitting: Advanced Practice Midwife

## 2022-10-10 ENCOUNTER — Other Ambulatory Visit: Payer: Self-pay

## 2022-10-10 ENCOUNTER — Encounter: Payer: Self-pay | Admitting: Advanced Practice Midwife

## 2022-10-10 VITALS — BP 118/86 | HR 94 | Wt 187.3 lb

## 2022-10-10 DIAGNOSIS — N898 Other specified noninflammatory disorders of vagina: Secondary | ICD-10-CM | POA: Insufficient documentation

## 2022-10-10 DIAGNOSIS — Z3403 Encounter for supervision of normal first pregnancy, third trimester: Secondary | ICD-10-CM

## 2022-10-10 DIAGNOSIS — Z3A35 35 weeks gestation of pregnancy: Secondary | ICD-10-CM

## 2022-10-10 LAB — POCT URINALYSIS DIP (DEVICE)
Bilirubin Urine: NEGATIVE
Glucose, UA: NEGATIVE mg/dL
Hgb urine dipstick: NEGATIVE
Ketones, ur: NEGATIVE mg/dL
Nitrite: NEGATIVE
Protein, ur: NEGATIVE mg/dL
Specific Gravity, Urine: 1.02 (ref 1.005–1.030)
Urobilinogen, UA: 0.2 mg/dL (ref 0.0–1.0)
pH: 7.5 (ref 5.0–8.0)

## 2022-10-10 MED ORDER — FLUCONAZOLE 150 MG PO TABS: 150.0000 mg | ORAL_TABLET | Freq: Once | ORAL | 0 refills | Status: DC

## 2022-10-10 MED ORDER — MICONAZOLE NITRATE 2 % EX POWD: CUTANEOUS | 0 refills | Status: DC | PRN

## 2022-10-10 NOTE — Addendum Note (Signed)
Addended by: Forrestine Him A on: 10/10/2022 11:54 AM   Modules accepted: Orders

## 2022-10-10 NOTE — Progress Notes (Addendum)
   PRENATAL VISIT NOTE  Subjective:  Victoria Hoffman is a 22 y.o. G1P0 at [redacted]w[redacted]d being seen today for ongoing prenatal care.  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  Patient reports backache.  Contractions: Irritability. Vag. Bleeding: None.  Movement: Present. Amount of fetal movement is decreased per normal. She is unsure if she gets 6 movements in one hour as she has not counted. She reports that intensity of fetal movement is normal when the baby does move.  Denies leaking of fluid.   The following portions of the patient's history were reviewed and updated as appropriate: allergies, current medications, past family history, past medical history, past social history, past surgical history and problem list.   Objective:   Vitals:   10/10/22 1001  BP: 118/86  Pulse: 94  Weight: 187 lb 4.8 oz (85 kg)    Fetal Status: Fetal Heart Rate (bpm): 127 Fundal Height: 36 cm Movement: Present     General:  Alert, oriented and cooperative. Patient is in no acute distress.  Skin: Skin is warm and dry. No rash noted.   Cardiovascular: Normal heart rate noted  Respiratory: Normal respiratory effort, no problems with respiration noted  Abdomen: Soft, gravid, appropriate for gestational age.  Pain/Pressure: Present     Pelvic: Cervical exam performed in the presence of a chaperone Dilation: Closed Effacement (%): 50   perineum and inner thighs irritated with rash consistent with yeast.   Extremities: Normal range of motion.  Edema: Mild pitting, slight indentation  Mental Status: Normal mood and affect. Normal behavior. Normal judgment and thought content.    NST:  Baseline: 120 Variability: moderate Accels: 15x15 Decels: none Toco: some UI   Reactive/Appropriate for GA   Assessment and Plan:  Pregnancy: G1P0 at [redacted]w[redacted]d 1. Encounter for supervision of normal first pregnancy in third trimester - routine care  2. [redacted] weeks gestation of pregnancy -35.5 today, will get GBS and wet prep  3. Vaginal discharge - Wet prep - RX diflucan x 1 and repeat x 1 if needed - Miconazole power PRN throughout the day - Probiotics, keep area clean/dry etc   4. Decreased fetal movement  - NST today  - reactive NST    Preterm labor symptoms and general obstetric precautions including but not limited to vaginal bleeding, contractions, leaking of fluid and fetal movement were reviewed in detail with the patient. Please refer to After Visit Summary for other counseling recommendations.   Return in about 1 week (around 10/17/2022).  Future Appointments  Date Time Provider [place]  10/18/2022  9:15 AM Gabriel Carina, CNM [facility]  10/19/2022  9:40 AM Tobb, Godfrey Pick, DO [facility] None  10/25/2022  9:15 AM Gabriel Carina, CNM [facility]  11/01/2022  9:15 AM Gabriel Carina, CNM [facility]  11/08/2022  2:35 PM Helaine Chess [facility]  11/15/2022  9:15 AM Helaine Chess [facility]  11/15/2022 10:15 AM [facility] NST [facility] DNP, CNM  10/10/22  11:15 AM

## 2022-10-11 LAB — CERVICOVAGINAL ANCILLARY ONLY
Bacterial Vaginitis (gardnerella): NEGATIVE
Candida Glabrata: NEGATIVE
Candida Vaginitis: POSITIVE — AB
Chlamydia: NEGATIVE
Comment: NEGATIVE
Comment: NEGATIVE
Comment: NEGATIVE
Comment: NEGATIVE
Comment: NORMAL
Neisseria Gonorrhea: NEGATIVE

## 2022-10-14 LAB — CULTURE, BETA STREP (GROUP B ONLY): Strep Gp B Culture: NEGATIVE

## 2022-10-18 ENCOUNTER — Ambulatory Visit (INDEPENDENT_AMBULATORY_CARE_PROVIDER_SITE_OTHER): Payer: BC Managed Care – PPO | Admitting: Certified Nurse Midwife

## 2022-10-18 ENCOUNTER — Other Ambulatory Visit: Payer: Self-pay

## 2022-10-18 VITALS — BP 127/85 | HR 92 | Wt 190.0 lb

## 2022-10-18 DIAGNOSIS — Z3A36 36 weeks gestation of pregnancy: Secondary | ICD-10-CM

## 2022-10-18 DIAGNOSIS — Z3493 Encounter for supervision of normal pregnancy, unspecified, third trimester: Secondary | ICD-10-CM

## 2022-10-18 DIAGNOSIS — A6004 Herpesviral vulvovaginitis: Secondary | ICD-10-CM

## 2022-10-18 NOTE — Progress Notes (Signed)
   PRENATAL VISIT NOTE  Subjective:  Victoria Hoffman is a 22 y.o. G1P0 at [redacted]w[redacted]d being seen today for ongoing prenatal care.  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  Patient reports no complaints.  Contractions: Irritability. Vag. Bleeding: None.  Movement: Present. Denies leaking of fluid.   The following portions of the patient's history were reviewed and updated as appropriate: allergies, current medications, past family history, past medical history, past social history, past surgical history and problem list.   Objective:   Vitals:   10/18/22 0937  BP: 127/85  Pulse: 92  Weight: 190 lb (86.2 kg)    Fetal Status: Fetal Heart Rate (bpm): 148 Fundal Height: 36 cm Movement: Present     General:  Alert, oriented and cooperative. Patient is in no acute distress.  Skin: Skin is warm and dry. No rash noted.   Cardiovascular: Normal heart rate noted  Respiratory: Normal respiratory effort, no problems with respiration noted  Abdomen: Soft, gravid, appropriate for gestational age.  Pain/Pressure: Present     Pelvic: Cervical exam deferred        Extremities: Normal range of motion.  Edema: None  Mental Status: Normal mood and affect. Normal behavior. Normal judgment and thought content.   Assessment and Plan:  Pregnancy: G1P0 at [redacted]w[redacted]d 1. Encounter for supervision of low-risk pregnancy in third trimester - Doing well, feeling regular and vigorous fetal movement   2. [redacted] weeks gestation of pregnancy - Routine OB care, GBS negative  3. Herpes simplex vulvovaginitis - Taking Valtrex 1000mg  daily  Preterm labor symptoms and general obstetric precautions including but not limited to vaginal bleeding, contractions, leaking of fluid and fetal movement  were reviewed in detail with the patient. Please refer to After Visit Summary for other counseling recommendations.   Return in about 1 week (around 10/25/2022) for IN-PERSON, LOB.  Future Appointments  Date Time Provider [place]  10/19/2022  9:40 AM Tobb, Godfrey Pick, DO [facility] None  10/25/2022  9:15 AM Gabriel Carina, CNM [facility]  11/01/2022  9:15 AM Gabriel Carina, CNM [facility]  11/08/2022  2:35 PM Helaine Chess [facility]  11/15/2022  9:15 AM Helaine Chess [facility]  11/15/2022 10:15 AM [facility] NST [facility] [place]    Gabriel Carina, CNM

## 2022-10-19 ENCOUNTER — Encounter: Payer: Self-pay | Admitting: Cardiology

## 2022-10-19 ENCOUNTER — Ambulatory Visit: Payer: BC Managed Care – PPO | Attending: Cardiology | Admitting: Cardiology

## 2022-10-19 VITALS — BP 102/60 | HR 100 | Ht 61.5 in | Wt 192.0 lb

## 2022-10-19 DIAGNOSIS — R0602 Shortness of breath: Secondary | ICD-10-CM | POA: Diagnosis not present

## 2022-10-19 DIAGNOSIS — Z8759 Personal history of other complications of pregnancy, childbirth and the puerperium: Secondary | ICD-10-CM

## 2022-10-19 DIAGNOSIS — O99210 Obesity complicating pregnancy, unspecified trimester: Secondary | ICD-10-CM

## 2022-10-19 NOTE — Patient Instructions (Signed)
Medication Instructions:  Your physician recommends that you continue on your current medications as directed. Please refer to the Current Medication list given to you today.  *If you need a refill on your cardiac medications before your next appointment, please call your pharmacy*   Lab Work: None If you have labs (blood work) drawn today and your tests are completely normal, you will receive your results only by: [place] (if you have MyChart) OR A paper copy in the mail If you have any lab test that is abnormal or we need to change your treatment, we will call you to review the results.   Testing/Procedures: None   Follow-Up: At [facility], you and your health needs are our priority.  As part of our continuing mission to provide you with exceptional heart care, we have created designated Provider Care Teams.  These Care Teams include your primary Cardiologist (physician) and Advanced Practice Providers (APPs -  Physician Assistants and Nurse Practitioners) who all work together to provide you with the care you need, when you need it.  Your next appointment:    As needed  Provider:   Berniece Salines, DO

## 2022-10-19 NOTE — Progress Notes (Signed)
Cardio-Obstetrics Clinic  Follow Up Note   Date:  10/19/2022   ID:  Victoria Hoffman, DOB 09-Mar-2001, MRN [id number]  PCP:  Kathyrn Lass   [place] Providers Cardiologist:  Berniece Salines, DO  Electrophysiologist:  None        Referring MD: No ref. provider found   Chief Complaint: " I am ok"  History of Present Illness:    Victoria Hoffman is a 22 y.o. female [G1P0] who returns for follow up of for shortness of breath in pregnancy.  I saw the patient November 2023 at that time she was recent shortness of breath this is a patient for echocardiogram.  The echocardiogram came back normal.  Since I saw her she has been doing well.  She offers no other complaints at this time.   Prior CV Studies Reviewed: The following studies were reviewed today: Echocardiogram IMPRESSIONS     1. Left ventricular ejection fraction, by estimation, is 55 to 60%. The  left ventricle has normal function. The left ventricle has no regional  wall motion abnormalities. Left ventricular diastolic parameters were  normal. The average left ventricular  global longitudinal strain is -22.5 %. The global longitudinal strain is  normal.   2. Right ventricular systolic function is normal. The right ventricular  size is normal. There is normal pulmonary artery systolic pressure.   3. A small pericardial effusion is present.   4. The mitral valve is normal in structure. Trivial mitral valve  regurgitation. No evidence of mitral stenosis.   5. The aortic valve was not well visualized. Unable to determine aortic  valve morphology due to image quality. Aortic valve regurgitation is not  visualized. No aortic stenosis is present.   6. The inferior vena cava is normal in size with greater than 50%  respiratory variability, suggesting right atrial pressure of 3 mmHg.   FINDINGS   Left Ventricle: Left ventricular ejection fraction, by estimation, is 55  to 60%. The left ventricle has normal function. The left  ventricle has no  regional wall motion abnormalities. The average left ventricular global  longitudinal strain is -22.5 %.  The global longitudinal strain is normal. 3D left ventricular ejection  fraction analysis performed but not reported based on interpreter  judgement due to suboptimal tracking. The left ventricular internal cavity  size was normal in size. There is no left  ventricular hypertrophy. Left ventricular diastolic parameters were  normal.   Right Ventricle: The right ventricular size is normal. No increase in  right ventricular wall thickness. Right ventricular systolic function is  normal. There is normal pulmonary artery systolic pressure. The tricuspid  regurgitant velocity is 2.19 m/s, and   with an assumed right atrial pressure of 3 mmHg, the estimated right  ventricular systolic pressure is [id number] mmHg.   Left Atrium: Left atrial size was normal in size.   Right Atrium: Right atrial size was normal in size.   Pericardium: A small pericardial effusion is present.   Mitral Valve: The mitral valve is normal in structure. Trivial mitral  valve regurgitation. No evidence of mitral valve stenosis.   Tricuspid Valve: The tricuspid valve is normal in structure. Tricuspid  valve regurgitation is trivial. No evidence of tricuspid stenosis.   Aortic Valve: The aortic valve was not well visualized. Aortic valve  regurgitation is not visualized. No aortic stenosis is present.   Pulmonic Valve: The pulmonic valve was not well visualized. Pulmonic valve  regurgitation is trivial. No evidence of pulmonic stenosis.  Aorta: The aortic root is normal in size and structure.   Venous: The inferior vena cava is normal in size with greater than 50%  respiratory variability, suggesting right atrial pressure of 3 mmHg.   IAS/Shunts: The atrial septum is grossly normal.   Past Medical History:  Diagnosis Date   Asthma    Celiac disease    HSV-1 (herpes simplex virus 1)  infection     Past Surgical History:  Procedure Laterality Date   LAPAROSCOPY        OB History     Gravida  1   Para      Term      Preterm      AB      Living         SAB      IAB      Ectopic      Multiple      Live Births                  Current Medications: Current Meds  Medication Sig   Albuterol Sulfate, sensor, 108 (90 Base) MCG/ACT AEPB Inhale into the lungs.   Prenatal Vit-Fe Fumarate-FA (PRENATAL MULTIVITAMIN) TABS tablet Take 1 tablet by mouth daily at 12 noon.   sertraline (ZOLOFT) 25 MG tablet Take 1 tablet (25 mg total) by mouth daily.   valACYclovir (VALTREX) 1000 MG tablet Take 1 tablet (1,000 mg total) by mouth daily.     Allergies:   Metronidazole and Wheat bran   Social History   Socioeconomic History   Marital status: Single    Spouse name: Not on file   Number of children: Not on file   Years of education: Not on file   Highest education level: Not on file  Occupational History   Not on file  Tobacco Use   Smoking status: Never   Smokeless tobacco: Never  Vaping Use   Vaping Use: Never used  Substance and Sexual Activity   Alcohol use: Not Currently   Drug use: Not Currently   Sexual activity: Not Currently    Birth control/protection: None  Other Topics Concern   Not on file  Social History Narrative   Not on file   Social Determinants of Health   Financial Resource Strain: Not on file  Food Insecurity: No Food Insecurity (08/16/2022)   Hunger Vital Sign    Worried About Running Out of Food in the Last Year: Never true    Ran Out of Food in the Last Year: Never true  Transportation Needs: No Transportation Needs (08/16/2022)   PRAPARE - Hydrologist (Medical): No    Lack of Transportation (Non-Medical): No  Physical Activity: Not on file  Stress: Not on file  Social Connections: Not on file      Family History  Problem Relation Age of Onset   Endometriosis Mother     Hyperlipidemia Mother    Hypertension Mother    Celiac disease Mother       ROS:   Please see the history of present illness.     All other systems reviewed and are negative.   Labs/EKG Reviewed:    EKG:   EKG is was not ordered today.    Recent Labs: 06/20/2022: ALT 10; BUN <5; Creatinine, Ser 0.62; Potassium 3.2; Sodium 137 08/16/2022: Hemoglobin 10.3; Platelets 267   Recent Lipid Panel No results found for: "CHOL", "TRIG", "HDL", "CHOLHDL", "LDLCALC", "LDLDIRECT"  Physical Exam:  VS:  BP 102/60   Pulse 100   Ht 5' 1.5" (1.562 m)   Wt 87.1 kg   LMP 02/02/2022   SpO2 99%   BMI 35.69 kg/m     Wt Readings from Last 3 Encounters:  10/19/22 87.1 kg  10/18/22 86.2 kg  10/10/22 85 kg     GEN:  Well nourished, well developed in no acute distress HEENT: Normal NECK: No JVD; No carotid bruits LYMPHATICS: No lymphadenopathy CARDIAC: RRR, no murmurs, rubs, gallops RESPIRATORY:  Clear to auscultation without rales, wheezing or rhonchi  ABDOMEN: Soft, non-tender, non-distended MUSCULOSKELETAL:  No edema; No deformity  SKIN: Warm and dry NEUROLOGIC:  Alert and oriented x 3 PSYCHIATRIC:  Normal affect    Risk Assessment/Risk Calculators:     CARPREG II Risk Prediction Index Score:  1.  The patient's risk for a primary cardiac event is 5%.            ASSESSMENT & PLAN:    Shortness of breath -he will discuss her echocardiogram results no changes at this time.  Patient Instructions  Medication Instructions:  Your physician recommends that you continue on your current medications as directed. Please refer to the Current Medication list given to you today.  *If you need a refill on your cardiac medications before your next appointment, please call your pharmacy*   Lab Work: None If you have labs (blood work) drawn today and your tests are completely normal, you will receive your results only by: [place] (if you have MyChart) OR A paper copy in the  mail If you have any lab test that is abnormal or we need to change your treatment, we will call you to review the results.   Testing/Procedures: None   Follow-Up: At [facility], you and your health needs are our priority.  As part of our continuing mission to provide you with exceptional heart care, we have created designated Provider Care Teams.  These Care Teams include your primary Cardiologist (physician) and Advanced Practice Providers (APPs -  Physician Assistants and Nurse Practitioners) who all work together to provide you with the care you need, when you need it.  Your next appointment:    As needed  Provider:   Berniece Salines, DO    Dispo:  No follow-ups on file.   Medication Adjustments/Labs and Tests Ordered: Current medicines are reviewed at length with the patient today.  Concerns regarding medicines are outlined above.  Tests Ordered: No orders of the defined types were placed in this encounter.  Medication Changes: No orders of the defined types were placed in this encounter.

## 2022-10-24 ENCOUNTER — Encounter (HOSPITAL_COMMUNITY): Payer: Self-pay | Admitting: Obstetrics and Gynecology

## 2022-10-24 ENCOUNTER — Inpatient Hospital Stay (HOSPITAL_COMMUNITY)
Admission: AD | Admit: 2022-10-24 | Discharge: 2022-10-24 | Disposition: A | Discharge status: Home / Self Care | Payer: BC Managed Care – PPO | Attending: Obstetrics and Gynecology | Admitting: Obstetrics and Gynecology

## 2022-10-24 ENCOUNTER — Other Ambulatory Visit: Payer: Self-pay

## 2022-10-24 DIAGNOSIS — Z3689 Encounter for other specified antenatal screening: Secondary | ICD-10-CM

## 2022-10-24 DIAGNOSIS — O99513 Diseases of the respiratory system complicating pregnancy, third trimester: Secondary | ICD-10-CM | POA: Insufficient documentation

## 2022-10-24 DIAGNOSIS — O98513 Other viral diseases complicating pregnancy, third trimester: Secondary | ICD-10-CM | POA: Diagnosis not present

## 2022-10-24 DIAGNOSIS — Z3A37 37 weeks gestation of pregnancy: Secondary | ICD-10-CM

## 2022-10-24 DIAGNOSIS — O368130 Decreased fetal movements, third trimester, not applicable or unspecified: Secondary | ICD-10-CM | POA: Insufficient documentation

## 2022-10-24 NOTE — MAU Provider Note (Signed)
  History     CSN: [id number]  Arrival date and time: 10/24/22 1518   Event Date/Time   First Provider Initiated Contact with Patient 10/24/22 1547      Chief Complaint  Patient presents with   Decreased Fetal Movement   HPI Victoria Hoffman is a 22 y.o. G1P0 at 1w5dwho presents to MAU for evaluation of decreased fetal movement.  OB History     Gravida  1   Para      Term      Preterm      AB      Living         SAB      IAB      Ectopic      Multiple      Live Births              Past Medical History:  Diagnosis Date   Asthma    Celiac disease    HSV-1 (herpes simplex virus 1) infection     Past Surgical History:  Procedure Laterality Date   LAPAROSCOPY      Family History  Problem Relation Age of Onset   Endometriosis Mother    Hyperlipidemia Mother    Hypertension Mother    Celiac disease Mother     Social History   Tobacco Use   Smoking status: Never   Smokeless tobacco: Never  Vaping Use   Vaping Use: Never used  Substance Use Topics   Alcohol use: Not Currently   Drug use: Not Currently    Allergies:  Allergies  Allergen Reactions   Metronidazole Nausea And Vomiting   Wheat Bran Other (See Comments) and Rash    Medications Prior to Admission  Medication Sig Dispense Refill Last Dose   Albuterol Sulfate, sensor, 108 (90 Base) MCG/ACT AEPB Inhale into the lungs.   Past Week   Prenatal Vit-Fe Fumarate-FA (PRENATAL MULTIVITAMIN) TABS tablet Take 1 tablet by mouth daily at 12 noon.   10/23/2022   sertraline (ZOLOFT) 25 MG tablet Take 1 tablet (25 mg total) by mouth daily. 30 tablet 11 10/23/2022   valACYclovir (VALTREX) 1000 MG tablet Take 1 tablet (1,000 mg total) by mouth daily. 90 tablet 2 10/23/2022    Review of Systems Physical Exam   Blood pressure 129/77, pulse 94, temperature 98.9 F (37.2 C), temperature source Oral, resp. rate 14, height 5' 2"$  (1.575 m), weight 87.7 kg, last menstrual period 02/02/2022, SpO2 99  %.  Physical Exam  MAU Course  Procedures  MDM  Assessment and Plan  ***  SDarlina Rumpf2/20/2024, 4:04 PM

## 2022-10-24 NOTE — Discharge Instructions (Addendum)
  Cervical Ripening (to get your cervix ready for labor) : May try one or all:  Red Raspberry Leaf capsules:  two 300mg  or 400mg  tablets with each meal, 2-3 times a day  Potential Side Effects Of Raspberry Leaf:  Most women do not experience any side effects from drinking raspberry leaf tea. However, nausea and loose stools are possible     Evening Primrose Oil capsules: may take 1 to 3 capsules daily. May also prick one to release the oil and insert it into your vagina at night.  Some of the potential side effects:  Upset stomach  Loose stools or diarrhea  Headaches  Nausea   6 Dates a day (may taste better if warmed in microwave until soft). Found where raisins are in the grocery store   The MilesCircuit This circuit takes at least 90 minutes to complete so clear your schedule and make mental preparations so you can relax in your environment.  The second step requires a lot of pillows so gather them up before beginning.  Before starting, you should empty your bladder! Have a nice drink nearby, and make sure it has a straw! If you are having contractions, this circuit should be done through contractions, try not to change positions between steps.  Step One: Open-kneeChest Stay in this position for 30 minutes, start in cat/cow, then drop your chest as low as you can to the bed or the floor and your bottom as high as you can. Knees should be fairly wide apart, and the angle between the torso/thighs should be wider than 90 degrees. Wiggle around, prop with lots of pillows and use this time to get totally relaxed. This position allows the baby to scoot out of the pelvis a bit and gives them room to rotate, shift their head position, etc. If the pregnant person finds it helpful, careful positioning with a rebozo under the belly, with gentle tension from a support person behind can help maintain this position for the full 30 minutes.  Step MWU:[id number]  SideLying Roll to your left side, bringing your top leg as high as possible and keeping your bottom leg straight. Roll forward as much as possible, again using a lot of pillows. Sink into the bed and relax some more. If you fall asleep, that's totally okay and you can stay there! If not, stay here for at least another half an hour. Try and get your top right leg up towards your head and get as rolled over onto your belly as much as possible. If you repeat the circuit during labor, try alternating left and right sides. We know the photo the left is actually right side... just flip the image in your head.  Step Three: Moving and Lunges Lunge, walk stairs facing sideways, 2 at a time, (have a spotter downstairs of you!), take a walk outside with one foot on the curb and the other on the street, sit on a birth ball and hula- anything that's upright and putting your pelvis in open, asymmetrical positions. Spend at least 30 minutes doing this one as well to give your baby a chance to move down. If you are lunging or stair or curb walking, you should lunge/walk/go up stairs in the direction that feels better to you. The key with the lunge is that the toes of the higher leg and mom's belly button should be at right angles. Do not lunge over your knee, that closes the pelvis.  You got this!!!!!!!!!!!!!!!!!!!!!!! :)

## 2022-10-24 NOTE — MAU Note (Signed)
Victoria Hoffman is a 22 y.o. at 5w5dhere in MAU reporting: DFM started today, has only felt 3 movements today. No pain but is having a lot of pressure. No bleeding or LOF.  Onset of complaint: today  Pain score: 0/10  FHT:135  Lab orders placed from triage: none

## 2022-10-25 ENCOUNTER — Ambulatory Visit (INDEPENDENT_AMBULATORY_CARE_PROVIDER_SITE_OTHER): Payer: BC Managed Care – PPO | Admitting: Certified Nurse Midwife

## 2022-10-25 VITALS — BP 131/92 | HR 92 | Wt 191.0 lb

## 2022-10-25 DIAGNOSIS — Z3493 Encounter for supervision of normal pregnancy, unspecified, third trimester: Secondary | ICD-10-CM

## 2022-10-25 DIAGNOSIS — Z3A37 37 weeks gestation of pregnancy: Secondary | ICD-10-CM

## 2022-10-25 DIAGNOSIS — F419 Anxiety disorder, unspecified: Secondary | ICD-10-CM

## 2022-10-25 DIAGNOSIS — A6004 Herpesviral vulvovaginitis: Secondary | ICD-10-CM

## 2022-10-25 NOTE — Progress Notes (Signed)
   PRENATAL VISIT NOTE  Subjective:  Victoria Hoffman is a 22 y.o. G1P0 at 66w6dbeing seen today for ongoing prenatal care.  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  Patient reports  no headaches but describes seeing spots sometimes (like in the shower) .  Contractions: Irritability. Vag. Bleeding: None.  Movement: Present. Denies leaking of fluid.   The following portions of the patient's history were reviewed and updated as appropriate: allergies, current medications, past family history, past medical history, past social history, past surgical history and problem list.   Objective:   Vitals:   10/25/22 1026 10/25/22 1120  BP: (!) 129/92 (!) 131/92  Pulse: 87 92  Weight: 191 lb (86.6 kg)    Fetal Status: Fetal Heart Rate (bpm): 133 Fundal Height: 38 cm Movement: Present     General:  Alert, oriented and cooperative. Patient is in no acute distress.  Skin: Skin is warm and dry. No rash noted.   Cardiovascular: Normal heart rate noted  Respiratory: Normal respiratory effort, no problems with respiration noted  Abdomen: Soft, gravid, appropriate for gestational age.  Pain/Pressure: Present     Pelvic: Cervical exam deferred        Extremities: Normal range of motion.  Edema: Trace  Mental Status: Normal mood and affect. Normal behavior. Normal judgment and thought content.   Assessment and Plan:  Pregnancy: G1P0 at 369w6d. Encounter for supervision of low-risk pregnancy in third trimester - Doing well, feeling regular and vigorous fetal movement   2. [redacted] weeks gestation of pregnancy - Routine OB care  - BP elevated today x2, will recheck on Friday and schedule IOL for gHTN if she meets criteria. Aware of PEC precautions.  3. Herpes simplex  vulvovaginitis - On Valtrex, discussed high success of suppressive therapy   4. Anxiety - Doing well   Preterm labor symptoms and general obstetric precautions including but not limited to vaginal bleeding, contractions, leaking of fluid and fetal movement were reviewed in detail with the patient. Please refer to After Visit Summary for other counseling recommendations.   Return in about 1 week (around 11/01/2022) for  LOB, IN-PERSON.  Future Appointments  Date Time Provider Department Center  11/01/2022  9:15 AM [facility]/02/2023  2:35 PM [facility]3/13/2024  9:15 AM [facility]3/13/2024 10:15 AM [facility]  JaGabriel CarinaCNM

## 2022-10-27 ENCOUNTER — Ambulatory Visit: Payer: BC Managed Care – PPO

## 2022-10-27 ENCOUNTER — Telehealth: Payer: Self-pay | Admitting: Certified Nurse Midwife

## 2022-10-27 NOTE — Telephone Encounter (Signed)
Pt called to report headache with visual disturbances and nausea with an episode of vomiting (has a history of migraines and states this one resembles her typical migraines). Took BP at home and was 107/58. Took '500mg'$  of Tylenol before bed which did not completely relieve her headache. Has a BP check in the office later this morning but wants to stay home and rest. Given normal BP, advised to take '1000mg'$  of Tylenol with a caffeinated drink (or Excedrin Tension) and rest. If headache does not ease with this treatment she is to present to MAU for serial Bps/headache protocol. Denies contractions, cramping, vaginal bleeding or any other physical complaints, feeling fetal movement.  Message sent to the office to cancel her nurse visit this morning.  Gaylan Gerold, CNM, MSN, [place] Certified Nurse Midwife, [facility]

## 2022-11-01 ENCOUNTER — Ambulatory Visit (INDEPENDENT_AMBULATORY_CARE_PROVIDER_SITE_OTHER): Payer: BC Managed Care – PPO | Admitting: Certified Nurse Midwife

## 2022-11-01 ENCOUNTER — Encounter: Payer: Self-pay | Admitting: *Deleted

## 2022-11-01 ENCOUNTER — Other Ambulatory Visit: Payer: Self-pay

## 2022-11-01 VITALS — BP 128/82 | HR 118 | Wt 192.6 lb

## 2022-11-01 DIAGNOSIS — R030 Elevated blood-pressure reading, without diagnosis of hypertension: Secondary | ICD-10-CM

## 2022-11-01 DIAGNOSIS — Z3493 Encounter for supervision of normal pregnancy, unspecified, third trimester: Secondary | ICD-10-CM

## 2022-11-01 DIAGNOSIS — Z3A38 38 weeks gestation of pregnancy: Secondary | ICD-10-CM

## 2022-11-01 DIAGNOSIS — A6004 Herpesviral vulvovaginitis: Secondary | ICD-10-CM

## 2022-11-01 DIAGNOSIS — G43809 Other migraine, not intractable, without status migrainosus: Secondary | ICD-10-CM

## 2022-11-01 NOTE — Progress Notes (Signed)
   PRENATAL VISIT NOTE  Subjective:  Victoria Hoffman is a 22 y.o. G1P0 at 61w6dbeing seen today for ongoing prenatal care.  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  Patient reports no complaints.  Contractions: Irritability. Vag. Bleeding: None.  Movement: Present. Denies leaking of fluid.   The following portions of the patient's history were reviewed and updated as appropriate: allergies, current medications, past family history, past medical history, past social history, past surgical history and problem list.   Objective:   Vitals:   11/01/22 0929  BP: 128/82  Pulse: (!) 118  Weight: 192 lb 9.6 oz (87.4 kg)    Fetal Status: Fetal Heart Rate (bpm): 138 Fundal Height: 39 cm Movement: Present     General:  Alert, oriented and cooperative. Patient is in no acute distress.  Skin: Skin is warm and dry. No rash noted.   Cardiovascular: Normal heart rate noted  Respiratory: Normal respiratory effort, no problems with respiration noted  Abdomen: Soft, gravid, appropriate for gestational age.  Pain/Pressure: Present     Pelvic: Cervical exam deferred        Extremities: Normal range of motion.  Edema: None  Mental Status: Normal mood and affect. Normal behavior. Normal judgment and thought content.   Assessment and Plan:  Pregnancy: G1P0 at 391w6d. Encounter for supervision of low-risk pregnancy in third trimester - Doing well, feeling regular and vigorous fetal movement   2. [redacted] weeks gestation of pregnancy - Routine OB care   3. 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 Elevated blood pressure reading - BP normal today, no s/sx PEC  Term labor symptoms and general obstetric precautions including but not limited to vaginal bleeding,  contractions, leaking of fluid and fetal movement were reviewed in detail with the patient. Please refer to After Visit Summary for other counseling recommendations.   Return in about 1 week (around 11/08/2022) for IN-PERSON, LOB.  Future Appointments  Date Time Provider [place]/02/2023  2:35 PM [facility]MK[facility]3/13/2024  9:15 AM WaGabriel CarinaCNM [facility]3/13/2024 10:15 AM [facility] NST [facility]  JaGabriel CarinaCNM

## 2022-11-02 ENCOUNTER — Inpatient Hospital Stay (EMERGENCY_DEPARTMENT_HOSPITAL)
Admission: AD | Admit: 2022-11-02 | Discharge: 2022-11-02 | Disposition: A | Discharge status: Home / Self Care | Payer: BC Managed Care – PPO | Source: Home / Self Care | Attending: Obstetrics and Gynecology | Admitting: Obstetrics and Gynecology

## 2022-11-02 ENCOUNTER — Inpatient Hospital Stay (HOSPITAL_BASED_OUTPATIENT_CLINIC_OR_DEPARTMENT_OTHER): Payer: BC Managed Care – PPO

## 2022-11-02 ENCOUNTER — Encounter (HOSPITAL_COMMUNITY): Payer: Self-pay | Admitting: Obstetrics and Gynecology

## 2022-11-02 ENCOUNTER — Other Ambulatory Visit: Payer: Self-pay

## 2022-11-02 ENCOUNTER — Inpatient Hospital Stay (HOSPITAL_COMMUNITY): Payer: BC Managed Care – PPO

## 2022-11-02 DIAGNOSIS — O9A213 Injury, poisoning and certain other consequences of external causes complicating pregnancy, third trimester: Secondary | ICD-10-CM | POA: Insufficient documentation

## 2022-11-02 DIAGNOSIS — O26893 Other specified pregnancy related conditions, third trimester: Secondary | ICD-10-CM

## 2022-11-02 DIAGNOSIS — R102 Pelvic and perineal pain: Secondary | ICD-10-CM | POA: Insufficient documentation

## 2022-11-02 DIAGNOSIS — H538 Other visual disturbances: Secondary | ICD-10-CM | POA: Insufficient documentation

## 2022-11-02 DIAGNOSIS — Z3A39 39 weeks gestation of pregnancy: Secondary | ICD-10-CM | POA: Insufficient documentation

## 2022-11-02 DIAGNOSIS — R1011 Right upper quadrant pain: Secondary | ICD-10-CM | POA: Diagnosis not present

## 2022-11-02 DIAGNOSIS — W1839XA Other fall on same level, initial encounter: Secondary | ICD-10-CM

## 2022-11-02 DIAGNOSIS — S93401A Sprain of unspecified ligament of right ankle, initial encounter: Secondary | ICD-10-CM | POA: Insufficient documentation

## 2022-11-02 DIAGNOSIS — R519 Headache, unspecified: Secondary | ICD-10-CM

## 2022-11-02 DIAGNOSIS — O134 Gestational [pregnancy-induced] hypertension without significant proteinuria, complicating childbirth: Secondary | ICD-10-CM | POA: Diagnosis not present

## 2022-11-02 DIAGNOSIS — R1031 Right lower quadrant pain: Secondary | ICD-10-CM

## 2022-11-02 DIAGNOSIS — W19XXXA Unspecified fall, initial encounter: Secondary | ICD-10-CM | POA: Insufficient documentation

## 2022-11-02 DIAGNOSIS — Z3493 Encounter for supervision of normal pregnancy, unspecified, third trimester: Secondary | ICD-10-CM

## 2022-11-02 LAB — CBC WITH DIFFERENTIAL/PLATELET
Abs Immature Granulocytes: 0.12 10*3/uL — ABNORMAL HIGH (ref 0.00–0.07)
Basophils Absolute: 0 10*3/uL (ref 0.0–0.1)
Basophils Relative: 0 %
Eosinophils Absolute: 0.1 10*3/uL (ref 0.0–0.5)
Eosinophils Relative: 1 %
HCT: 37.2 % (ref 36.0–46.0)
Hemoglobin: 12.3 g/dL (ref 12.0–15.0)
Immature Granulocytes: 1 %
Lymphocytes Relative: 18 %
Lymphs Abs: 1.7 10*3/uL (ref 0.7–4.0)
MCH: 30.1 pg (ref 26.0–34.0)
MCHC: 33.1 g/dL (ref 30.0–36.0)
MCV: 91 fL (ref 80.0–100.0)
Monocytes Absolute: 0.9 10*3/uL (ref 0.1–1.0)
Monocytes Relative: 10 %
Neutro Abs: 6.3 10*3/uL (ref 1.7–7.7)
Neutrophils Relative %: 70 %
Platelets: 234 10*3/uL (ref 150–400)
RBC: 4.09 MIL/uL (ref 3.87–5.11)
RDW: 14.6 % (ref 11.5–15.5)
WBC: 9.1 10*3/uL (ref 4.0–10.5)
nRBC: 0 % (ref 0.0–0.2)

## 2022-11-02 LAB — COMPREHENSIVE METABOLIC PANEL
ALT: 12 U/L (ref 0–44)
AST: 24 U/L (ref 15–41)
Albumin: 3.1 g/dL — ABNORMAL LOW (ref 3.5–5.0)
Alkaline Phosphatase: 179 U/L — ABNORMAL HIGH (ref 38–126)
Anion gap: 11 (ref 5–15)
BUN: 7 mg/dL (ref 6–20)
CO2: 22 mmol/L (ref 22–32)
Calcium: 9.2 mg/dL (ref 8.9–10.3)
Chloride: 102 mmol/L (ref 98–111)
Creatinine, Ser: 0.8 mg/dL (ref 0.44–1.00)
GFR, Estimated: >60 mL/min (ref >60)
Glucose, Bld: 82 mg/dL (ref 70–99)
Potassium: 3.9 mmol/L (ref 3.5–5.1)
Sodium: 135 mmol/L (ref 135–145)
Total Bilirubin: 0.4 mg/dL (ref 0.3–1.2)
Total Protein: 6.9 g/dL (ref 6.5–8.1)

## 2022-11-02 LAB — PROTEIN / CREATININE RATIO, URINE
Creatinine, Urine: 92 mg/dL
Protein Creatinine Ratio: 0.13 mg/mg{Cre} (ref 0.00–0.15)
Total Protein, Urine: 12 mg/dL

## 2022-11-02 MED ORDER — CAFFEINE 200 MG PO TABS
200.0000 mg | ORAL_TABLET | Freq: Once | ORAL | Status: AC
  Administered 2022-11-02: 200 mg via ORAL
  Filled 2022-11-02: qty 1

## 2022-11-02 MED ORDER — ACETAMINOPHEN 500 MG PO TABS
1000.0000 mg | ORAL_TABLET | Freq: Once | ORAL | Status: AC
  Administered 2022-11-02: 1000 mg via ORAL
  Filled 2022-11-02: qty 2

## 2022-11-02 NOTE — MAU Provider Note (Signed)
History     CSN: [id number]  Arrival date and time: 11/02/22 1655   Event Date/Time   First Provider Initiated Contact with Patient 11/02/22 1758      Chief Complaint  Patient presents with   Abdominal Pain   Pelvic Pain   Fall   Ankle Pain   Victoria Hoffman , a  22 y.o. G1P0 at 67w0dpresents to MAU with complaints of headache, blurred vision, ankle pain and abdominal pain all after a fall.   Headache and blurred vision:  patient states she was walking her dog and rolled her ankle on the sidewalk and fell. She denies hitting her head during the fall. She describes headache as throbbing and states its right on the front of her forehead. Currently rating pain a 6/10. Denies attempting to relieve. She also endorse blurred vision that started happening after the fall. She states she can read fine up close just at a distance her vision is blurred. She denies seeing floaters, flashes of light or seeing black. She denies epigastric pain, and new onset swelling.   Ankle: Patient reports when she fell she rolled her ankle. She denies being able to bear weight. Patient arrived to MAU in a boot. Patient states she could not walk without it. She is rating her foot pain a 8/10. She denies attempting to relieve symptoms and states pain is worse with movement.   Abdominal pain: since fall patient reports pain to her right side. She states she fell on the curb and landed on her right side. States she "could not catch herself" and "rolled onto that side."  She denies new onset of contractions and/or vaginal bleeding or leaking of fluid. She describes pain as a soreness and rates pain a 4/10. She denies attempting to relieve symptoms. She denies bruising or a hard abdomen. She endorses positive fetal movement.          OB History     Gravida  1   Para      Term      Preterm      AB      Living         SAB      IAB      Ectopic      Multiple      Live Births              Past  Medical History:  Diagnosis Date   Asthma    Celiac disease    HSV-1 (herpes simplex virus 1) infection     Past Surgical History:  Procedure Laterality Date   LAPAROSCOPY      Family History  Problem Relation Age of Onset   Endometriosis Mother    Hyperlipidemia Mother    Hypertension Mother    Celiac disease Mother     Social History   Tobacco Use   Smoking status: Never   Smokeless tobacco: Never  Vaping Use   Vaping Use: Never used  Substance Use Topics   Alcohol use: Not Currently   Drug use: Not Currently    Allergies:  Allergies  Allergen Reactions   Metronidazole Nausea And Vomiting   Wheat Bran Other (See Comments) and Rash    Medications Prior to Admission  Medication Sig Dispense Refill Last Dose   Prenatal Vit-Fe Fumarate-FA (PRENATAL MULTIVITAMIN) TABS tablet Take 1 tablet by mouth daily at 12 noon.   11/02/2022   sertraline (ZOLOFT) 25 MG tablet Take 1 tablet (25 mg total) by  mouth daily. 30 tablet 11 11/01/2022   valACYclovir (VALTREX) 1000 MG tablet Take 1 tablet (1,000 mg total) by mouth daily. 90 tablet 2 11/01/2022   Albuterol Sulfate, sensor, 108 (90 Base) MCG/ACT AEPB Inhale into the lungs.       Review of Systems  Constitutional:  Negative for chills, fatigue and fever.  Eyes:  Negative for pain and visual disturbance.  Respiratory:  Negative for apnea, shortness of breath and wheezing.   Cardiovascular:  Positive for leg swelling. Negative for chest pain and palpitations.  Gastrointestinal:  Positive for abdominal pain. Negative for constipation, diarrhea, nausea and vomiting.  Genitourinary:  Positive for pelvic pain and vaginal pain. Negative for difficulty urinating, dysuria, vaginal bleeding and vaginal discharge.  Musculoskeletal:  Negative for back pain.  Skin:  Negative for color change.  Neurological:  Positive for headaches. Negative for dizziness, seizures, weakness and light-headedness.  Psychiatric/Behavioral:  Negative for  suicidal ideas.    Physical Exam   Blood pressure 133/76, pulse (!) 106, temperature 98.2 F (36.8 C), temperature source Oral, resp. rate 20, last menstrual period 02/02/2022.  Physical Exam Vitals and nursing note reviewed. Exam conducted with a chaperone present.  Constitutional:      General: She is not in acute distress.    Appearance: Normal appearance.  HENT:     Head: Normocephalic.  Cardiovascular:     Rate and Rhythm: Normal rate and regular rhythm.  Pulmonary:     Effort: Pulmonary effort is normal.  Abdominal:     Palpations: Abdomen is soft.     Tenderness: There is no abdominal tenderness.     Comments: No bruising observed to abdomen.   Musculoskeletal:     Cervical back: Normal range of motion.  Feet:     Comments: Moderate amount of swelling noted to right ankle. Patient able to wiggle toes, and flex foot with minimal difficulty.  Skin:    General: Skin is warm and dry.     Capillary Refill: Capillary refill takes less than 2 seconds.  Neurological:     Mental Status: She is alert and oriented to person, place, and time.  Psychiatric:        Mood and Affect: Mood normal.    FHT: 125bpm with moderate variability 15x15 accels no decles noted.  - Toco irregular contractions mild to palpation  Dilation: 1.5 Effacement (%): 60 Station: -2 Exam by:: Gailen Shelter, CNM   MAU Course  Procedures Orders Placed This Encounter  Procedures   DG Ankle 2 Views Right   [place] MFM OB Limited   CBC with Differential/Platelet   Comprehensive metabolic panel   Protein / creatinine ratio, urine   Discharge patient   Results for orders placed or performed during the hospital encounter of 11/02/22 (from the past 24 hour(s))  Protein / creatinine ratio, urine     Status: None   Collection Time: 11/02/22  6:21 PM  Result Value Ref Range   Creatinine, Urine 92 mg/dL   Total Protein, Urine 12 mg/dL   Protein Creatinine Ratio 0.13 0.00 - 0.15 mg/mg[Cre]  CBC with  Differential/Platelet     Status: Abnormal   Collection Time: 11/02/22  6:39 PM  Result Value Ref Range   WBC 9.1 4.0 - 10.5 K/uL   RBC 4.09 3.87 - 5.11 MIL/uL   Hemoglobin 12.3 12.0 - 15.0 g/dL   HCT 37.2 36.0 - 46.0 %   MCV 91.0 80.0 - 100.0 fL   MCH 30.1 26.0 - 34.0 pg  MCHC 33.1 30.0 - 36.0 g/dL   RDW 14.6 11.5 - 15.5 %   Platelets 234 150 - 400 K/uL   nRBC 0.0 0.0 - 0.2 %   Neutrophils Relative % 70 %   Neutro Abs 6.3 1.7 - 7.7 K/uL   Lymphocytes Relative 18 %   Lymphs Abs 1.7 0.7 - 4.0 K/uL   Monocytes Relative 10 %   Monocytes Absolute 0.9 0.1 - 1.0 K/uL   Eosinophils Relative 1 %   Eosinophils Absolute 0.1 0.0 - 0.5 K/uL   Basophils Relative 0 %   Basophils Absolute 0.0 0.0 - 0.1 K/uL   Immature Granulocytes 1 %   Abs Immature Granulocytes 0.12 (H) 0.00 - 0.07 K/uL  Comprehensive metabolic panel     Status: Abnormal   Collection Time: 11/02/22  6:39 PM  Result Value Ref Range   Sodium 135 135 - 145 mmol/L   Potassium 3.9 3.5 - 5.1 mmol/L   Chloride 102 98 - 111 mmol/L   CO2 22 22 - 32 mmol/L   Glucose, Bld 82 70 - 99 mg/dL   BUN 7 6 - 20 mg/dL   Creatinine, Ser 0.80 0.44 - 1.00 mg/dL   Calcium 9.2 8.9 - 10.3 mg/dL   Total Protein 6.9 6.5 - 8.1 g/dL   Albumin 3.1 (L) 3.5 - 5.0 g/dL   AST 24 15 - 41 U/L   ALT 12 0 - 44 U/L   Alkaline Phosphatase 179 (H) 38 - 126 U/L   Total Bilirubin 0.4 0.3 - 1.2 mg/dL   GFR, Estimated >60 >60 mL/min   Anion gap 11 5 - 15   DG Ankle 2 Views Right  Result Date: 11/02/2022 CLINICAL DATA:  Fall EXAM: RIGHT ANKLE - 2 VIEW COMPARISON:  None Available. FINDINGS: There is no evidence of fracture, dislocation, or joint effusion. There is no evidence of arthropathy or other focal bone abnormality. Soft tissues are unremarkable. IMPRESSION: Negative. Electronically Signed   By: Ronney Asters M.D.   On: 11/02/2022 19:03    MDM - X-ray of ankle revealed no breaks or fractures.  - PreE labs normal BPs Normal in MAU. Low suspicion for  preE.  - Ankle pain improved with PO Tylenol. Headache still 6/10. Caffeine tablet ordered.  - Prelim [place] results revealed a living fetus in cephalic presentation, anterior placenta and normal AFI. No placenta abruption or signs of trauma post fall.  - Cervix 1.5 cm low suspicion for preterm labor.  - Headache relieved with caffeine tablets and graham crackers.  - plan for discharge.   Assessment and Plan   1. Fall, initial encounter   2. Encounter for supervision of low-risk pregnancy in third trimester   3. Sprain of right ankle, unspecified ligament, initial encounter   4. Pregnancy headache in third trimester   5. [redacted] weeks gestation of pregnancy    - Reviewed ankle sprain and recommended to use RICE method at home for the next few days. Also recommended PO Tylenol for pain management as needed.  - Discussed that headaches can be common in pregnancy and can easily be brought on by stress/ traumatic events I.e falls. Discussed OTC options that are safe in pregnancy like Tylenol.  - Term labor precautions reviewed and worsening signs and return precautions provided.  - FHT appropriate for gestational age at time of discharge. - Patient discharged home in stable condition and may return to MAU as needed.    Jacquiline Doe, MSN CNM  11/02/2022, 5:58 PM

## 2022-11-02 NOTE — Discharge Instructions (Signed)
Things to Try After 37 weeks to Encourage Labor/Get Ready for Labor:    Try the [employer] at [email or web address] daily to improve baby's position and encourage the onset of labor.  Walk a little and rest a little every day.  Change positions often.  Cervical Ripening: May try one or both Red Raspberry Leaf capsules or tea:  two '300mg'$  or '400mg'$  tablets with each meal, 2-3 times a day, or 1-3 cups of tea daily  Potential Side Effects Of Raspberry Leaf:  Most women do not experience any side effects from drinking raspberry leaf tea. However, nausea and loose stools are possible   Evening Primrose Oil capsules: take 1 capsule by mouth and place one capsule in the vagina every night.    Some of the potential side effects:  Upset stomach  Loose stools or diarrhea  Headaches  Nausea  Sex can also help the cervix ripen and encourage labor onset.

## 2022-11-02 NOTE — MAU Note (Signed)
...  Victoria Hoffman is a 22 y.o. at 56w0dhere in MAU reporting: Fall that occurred around 1-1.5 hours ago. She reports she was walking her dog and was on the curb and she rolled her right ankle which caused her to fall down onto the concrete on her right side. She reports her hands were full so she was unable to catch herself from falling. She reports she hit the right side of her abdomen. Upon initial RN assessment, there are no scrapes, redness, or bruises on the patients abdomen. She does have right mid abdominal tenderness with palpation. She also endorses new onset pelvic pain post fall that is intermittent. RN noted that the patients left fingers have scattered scrapes with blood. Patient was wearing a fracture boot that she had at home and reports "I cannot walk without it." RN removed the boot and elevated patients right foot and applied ice pack. Mild swelling noted, no bruising noted at this time. Patient denies feeling any gushes or trickles of fluid. White discharge noted on underwear. Endorses fetal movement. Denies CTX.  Pain score:  3/10 pelvic pain 7/10 right ankle pain 5/10 right abdomen  Vitals:   11/02/22 1738  BP: (!) 150/83  Pulse: (!) 108  Resp: 20  Temp: 98.2 F (36.8 C)     FHT: 135 initial external Lab orders placed from triage: none

## 2022-11-04 ENCOUNTER — Encounter (HOSPITAL_COMMUNITY): Payer: Self-pay | Admitting: Obstetrics and Gynecology

## 2022-11-04 ENCOUNTER — Inpatient Hospital Stay (HOSPITAL_COMMUNITY)
Admission: AD | Admit: 2022-11-04 | Discharge: 2022-11-07 | DRG: 787 | Disposition: A | Discharge status: Home / Self Care | Payer: BC Managed Care – PPO | Attending: Obstetrics and Gynecology | Admitting: Obstetrics and Gynecology

## 2022-11-04 ENCOUNTER — Other Ambulatory Visit: Payer: Self-pay

## 2022-11-04 DIAGNOSIS — O9952 Diseases of the respiratory system complicating childbirth: Secondary | ICD-10-CM | POA: Diagnosis present

## 2022-11-04 DIAGNOSIS — Z3493 Encounter for supervision of normal pregnancy, unspecified, third trimester: Principal | ICD-10-CM

## 2022-11-04 DIAGNOSIS — A6000 Herpesviral infection of urogenital system, unspecified: Secondary | ICD-10-CM | POA: Diagnosis present

## 2022-11-04 DIAGNOSIS — O9902 Anemia complicating childbirth: Secondary | ICD-10-CM | POA: Diagnosis present

## 2022-11-04 DIAGNOSIS — O9832 Other infections with a predominantly sexual mode of transmission complicating childbirth: Secondary | ICD-10-CM | POA: Diagnosis present

## 2022-11-04 DIAGNOSIS — O134 Gestational [pregnancy-induced] hypertension without significant proteinuria, complicating childbirth: Secondary | ICD-10-CM | POA: Diagnosis not present

## 2022-11-04 DIAGNOSIS — Z3A39 39 weeks gestation of pregnancy: Secondary | ICD-10-CM

## 2022-11-04 DIAGNOSIS — J45909 Unspecified asthma, uncomplicated: Secondary | ICD-10-CM | POA: Diagnosis present

## 2022-11-04 LAB — COMPREHENSIVE METABOLIC PANEL
ALT: 13 U/L (ref 0–44)
AST: 22 U/L (ref 15–41)
Albumin: 3.1 g/dL — ABNORMAL LOW (ref 3.5–5.0)
Alkaline Phosphatase: 190 U/L — ABNORMAL HIGH (ref 38–126)
Anion gap: 11 (ref 5–15)
BUN: 5 mg/dL — ABNORMAL LOW (ref 6–20)
CO2: 21 mmol/L — ABNORMAL LOW (ref 22–32)
Calcium: 9.2 mg/dL (ref 8.9–10.3)
Chloride: 103 mmol/L (ref 98–111)
Creatinine, Ser: 0.75 mg/dL (ref 0.44–1.00)
GFR, Estimated: >60 mL/min (ref >60)
Glucose, Bld: 75 mg/dL (ref 70–99)
Potassium: 3.7 mmol/L (ref 3.5–5.1)
Sodium: 135 mmol/L (ref 135–145)
Total Bilirubin: 0.3 mg/dL (ref 0.3–1.2)
Total Protein: 7 g/dL (ref 6.5–8.1)

## 2022-11-04 LAB — TYPE AND SCREEN
ABO/RH(D): A POS
Antibody Screen: NEGATIVE

## 2022-11-04 LAB — PROTEIN / CREATININE RATIO, URINE
Creatinine, Urine: 46 mg/dL
Total Protein, Urine: <6 mg/dL

## 2022-11-04 LAB — CBC
HCT: 37.6 % (ref 36.0–46.0)
Hemoglobin: 12.5 g/dL (ref 12.0–15.0)
MCH: 30.3 pg (ref 26.0–34.0)
MCHC: 33.2 g/dL (ref 30.0–36.0)
MCV: 91.3 fL (ref 80.0–100.0)
Platelets: 191 10*3/uL (ref 150–400)
RBC: 4.12 MIL/uL (ref 3.87–5.11)
RDW: 14.7 % (ref 11.5–15.5)
WBC: 8.8 10*3/uL (ref 4.0–10.5)
nRBC: 0 % (ref 0.0–0.2)

## 2022-11-04 LAB — POCT FERN TEST: POCT Fern Test: NEGATIVE

## 2022-11-04 MED ORDER — LACTATED RINGERS IV SOLN: 500.0000 mL | INTRAVENOUS | Status: DC | PRN

## 2022-11-04 MED ORDER — MISOPROSTOL 50MCG HALF TABLET
50.0000 ug | ORAL_TABLET | ORAL | Status: DC | PRN
  Administered 2022-11-04: 50 ug via ORAL
  Filled 2022-11-04: qty 1

## 2022-11-04 MED ORDER — ACETAMINOPHEN 325 MG PO TABS: 650.0000 mg | ORAL_TABLET | ORAL | Status: DC | PRN

## 2022-11-04 MED ORDER — LACTATED RINGERS IV SOLN: INTRAVENOUS | Status: DC

## 2022-11-04 MED ORDER — SOD CITRATE-CITRIC ACID 500-334 MG/5ML PO SOLN
30.0000 mL | ORAL | Status: DC | PRN
  Filled 2022-11-04: qty 30

## 2022-11-04 MED ORDER — LIDOCAINE HCL (PF) 1 % IJ SOLN: 30.0000 mL | INTRAMUSCULAR | Status: DC | PRN

## 2022-11-04 MED ORDER — OXYTOCIN BOLUS FROM INFUSION: 333.0000 mL | Freq: Once | INTRAVENOUS | Status: DC

## 2022-11-04 MED ORDER — OXYCODONE-ACETAMINOPHEN 5-325 MG PO TABS: 2.0000 | ORAL_TABLET | ORAL | Status: DC | PRN

## 2022-11-04 MED ORDER — OXYTOCIN-SODIUM CHLORIDE 30-0.9 UT/500ML-% IV SOLN: 2.5000 [IU]/h | INTRAVENOUS | Status: DC

## 2022-11-04 MED ORDER — OXYCODONE-ACETAMINOPHEN 5-325 MG PO TABS: 1.0000 | ORAL_TABLET | ORAL | Status: DC | PRN

## 2022-11-04 MED ORDER — TERBUTALINE SULFATE 1 MG/ML IJ SOLN: 0.2500 mg | Freq: Once | INTRAMUSCULAR | Status: DC | PRN

## 2022-11-04 MED ORDER — ONDANSETRON HCL 4 MG/2ML IJ SOLN: 4.0000 mg | Freq: Four times a day (QID) | INTRAMUSCULAR | Status: DC | PRN

## 2022-11-04 NOTE — Progress Notes (Signed)
Patient ID: Victoria Hoffman, female   DOB: [birth year], 22 y.o.   MRN: [id number]  Victoria Hoffman MRN: [id number]  Subjective: -Care assumed of 22 y.o. G1P0 at 70w2dwho presents for IOl s/t GHTN. In room to meet acquaintance of patient and family.  Patient resting in bed and reports discomfort with contractions. States she requested heating pad to improve comfort. Patient denies HA, visual disturbances, and RUQ pain. Reports edema in right ankle is due to sprain.   Objective: BP (!) 134/94   Pulse 88   Temp 98.1 F (36.7 C) (Oral)   Resp 16   Ht '5\' 1"'$  (1.549 m)   Wt 88 kg   LMP 02/02/2022   SpO2 99%   BMI 36.64 kg/m  No intake/output data recorded. No intake/output data recorded.  Fetal Monitoring: FHT: 125 bpm, Mod Var, -Decels, +Accels UC: Palpates mild    Physical Exam: General appearance: alert, well appearing, and in no distress. Chest: normal rate and regular rhythm.  clear to auscultation, no wheezes, rales or rhonchi, symmetric air entry. Abdominal exam: Gravid, Soft RT, Ctx palpate mild. Extremities: Some edema of lower extremities.  Skin exam: Warm Dry  Vaginal Exam: SVE:   Dilation: 1.5 Effacement (%): 70 Station: -2 Exam by:: Dr. CCaron PresumeMembranes:Intact Internal Monitors: None  Augmentation/Induction: Pitocin:None Cytotec: Ordered  Foley Bulb: In Place  Assessment:  IUP at 39.2 weeks Cat I FT  IOL s/t GHTN GBS Negative   Plan: -Cytotec ordered.  -Patient informed that okay to have heating pad for discomforts. Will notify nurse. -Continue other mgmt as ordered   JRiley Churches CNM 11/04/2022, 8:35 PM  Addendum -Nurse call reports patient checked and foley balloon in vagina. States patient 4/70. Instructed to give cytotec as ordered.   JMaryann ConnersMSN, CNM Advanced Practice Provider, [facility]

## 2022-11-04 NOTE — MAU Note (Signed)
Victoria Hoffman is a 22 y.o. at 9w2dhere in MAU reporting: she was having ctxs last night into this morning.  Reports ctxs stopped, but noticed LOF this morning @ 0700.  States has continued to feel damp.  Denies VB.  Reports +FM. LMP: NA Onset of complaint: today Pain score: 0 Vitals:   11/04/22 1736  BP: 130/79  Pulse: 90  Temp: 99 F (37.2 C)  SpO2: 99%     [id number] orders placed from triage:   None

## 2022-11-04 NOTE — H&P (Signed)
OBSTETRIC ADMISSION HISTORY AND PHYSICAL  Victoria Hoffman is a 22 y.o. female G1P0 with IUP at 36w2dby LMP presenting for IOL for gHTN.  Patient came to the MAU for rule out labor and was not found to be in labor but ruled in for gestational hypertension while here.. She reports +FMs, No LOF, no VB, no blurry vision, headaches or peripheral edema, and RUQ pain.  She plans on breast feeding. She request condoms for birth control. She received her prenatal care at  [facility] --->  Estimated Date of Delivery: 11/09/22  Sono:    '@[redacted]w[redacted]d'$ , CWD, cephalic presentation, anterior placenta  Antatomy scan @ 174w EFW 262 g, 55%  Prenatal History/Complications: None  Past Medical History: Past Medical History:  Diagnosis Date   Asthma    Celiac disease    HSV-1 (herpes simplex virus 1) infection     Past Surgical History: Past Surgical History:  Procedure Laterality Date   LAPAROSCOPY      Obstetrical History: OB History     Gravida  1   Para      Term      Preterm      AB      Living         SAB      IAB      Ectopic      Multiple      Live Births              Social History Social History   Socioeconomic History   Marital status: Single    Spouse name: Not on file   Number of children: Not on file   Years of education: Not on file   Highest education level: Not on file  Occupational History   Not on file  Tobacco Use   Smoking status: Never   Smokeless tobacco: Never  Vaping Use   Vaping Use: Never used  Substance and Sexual Activity   Alcohol use: Not Currently   Drug use: Not Currently   Sexual activity: Not Currently    Birth control/protection: None  Other Topics Concern   Not on file  Social History Narrative   Not on file   Social Determinants of Health   Financial Resource Strain: Not on file  Food Insecurity: No Food Insecurity (08/16/2022)   Hunger Vital Sign    Worried About Running Out of Food in the Last Year: Never  true    Ran Out of Food in the Last Year: Never true  Transportation Needs: No Transportation Needs (08/16/2022)   PRAPARE - THydrologist(Medical): No    Lack of Transportation (Non-Medical): No  Physical Activity: Not on file  Stress: Not on file  Social Connections: Not on file    Family History: Family History  Problem Relation Age of Onset   Endometriosis Mother    Hyperlipidemia Mother    Hypertension Mother    Celiac disease Mother     Allergies: Allergies  Allergen Reactions   Flagyl [Metronidazole] Nausea And Vomiting   Wheat Bran Rash    Medications Prior to Admission  Medication Sig Dispense Refill Last Dose   Prenatal Vit-Fe Fumarate-FA (PRENATAL MULTIVITAMIN) TABS tablet Take 1 tablet by mouth daily.   11/04/2022   sertraline (ZOLOFT) 25 MG tablet Take 1 tablet (25 mg total) by mouth daily. 30 tablet 11 11/03/2022   valACYclovir (VALTREX) 1000 MG tablet Take 1 tablet (1,000 mg  total) by mouth daily. 90 tablet 2 11/03/2022     Review of Systems   All systems reviewed and negative except as stated in HPI  Blood pressure (!) 140/88, pulse 96, temperature 99 F (37.2 C), temperature source Oral, height '5\' 1"'$  (1.549 m), weight 88 kg, last menstrual period 02/02/2022, SpO2 99 %. General appearance: alert, cooperative, and no distress Lungs: clear to auscultation bilaterally Heart: regular rate and rhythm Abdomen: Gravid Pelvic: 1/60/-2 Extremities:  no sign of DVT Presentation: cephalic Fetal monitoringBaseline: 120 bpm, Variability: Good {> 6 bpm), Accelerations: Reactive, and Decelerations: Absent Uterine activityFrequency: Every 10 minutes Dilation: 1.5 Effacement (%): 70 Station: -2 Exam by:: Dr. Caron Presume   Prenatal labs: ABO, Rh:   Antibody:   Rubella:   RPR: Non Reactive (12/13 0833)  HBsAg:    HIV: Non Reactive (12/13 0833)  GBS: Negative/-- (02/06 1155)  1 hr Glucola Normal Genetic screening  LR female Anatomy US  Normal  Prenatal Transfer Tool  Maternal Diabetes: No Genetic Screening: Normal Maternal Ultrasounds/Referrals: Normal Fetal Ultrasounds or other Referrals:  None Maternal Substance Abuse:  No Significant Maternal Medications:  None Significant Maternal Lab Results:  Group B Strep negative Number of Prenatal Visits:greater than 3 verified prenatal visits Other Comments:  None  Results for orders placed or performed during the hospital encounter of 11/04/22 (from the past 24 hour(s))  Fern Test   Collection Time: 11/04/22  6:38 PM  Result Value Ref Range   POCT Fern Test Negative = intact amniotic membranes     Patient Active Problem List   Diagnosis Date Noted   Asthma 06/22/2022   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 G1P0 at 39w2dhere forIOL for gHTN  #Labor:Induction initiated with FB. Would recommend cytotec once gets to the floor.  #Pain: Per patient request  #FWB: Cat 1  #ID:  GBS neg #MOF: Breast #MOC:Condoms  #Circ:  Yes  gHTN: Pre e labs pending. Mild range elevations   JConcepcion Living MD  11/04/2022, 6:44 PM

## 2022-11-05 ENCOUNTER — Inpatient Hospital Stay (HOSPITAL_COMMUNITY): Payer: BC Managed Care – PPO | Admitting: Certified Registered"

## 2022-11-05 ENCOUNTER — Encounter (HOSPITAL_COMMUNITY)
Admission: AD | Disposition: A | Discharge status: Home / Self Care | Payer: Self-pay | Source: Home / Self Care | Attending: Obstetrics and Gynecology

## 2022-11-05 ENCOUNTER — Other Ambulatory Visit: Payer: Self-pay

## 2022-11-05 ENCOUNTER — Encounter (HOSPITAL_COMMUNITY): Payer: Self-pay | Admitting: Family Medicine

## 2022-11-05 DIAGNOSIS — O134 Gestational [pregnancy-induced] hypertension without significant proteinuria, complicating childbirth: Secondary | ICD-10-CM | POA: Diagnosis not present

## 2022-11-05 DIAGNOSIS — Z3A39 39 weeks gestation of pregnancy: Secondary | ICD-10-CM | POA: Diagnosis not present

## 2022-11-05 LAB — CBC
HCT: 32.4 % — ABNORMAL LOW (ref 36.0–46.0)
Hemoglobin: 10.9 g/dL — ABNORMAL LOW (ref 12.0–15.0)
MCH: 30.3 pg (ref 26.0–34.0)
MCHC: 33.6 g/dL (ref 30.0–36.0)
MCV: 90 fL (ref 80.0–100.0)
Platelets: 212 10*3/uL (ref 150–400)
RBC: 3.6 MIL/uL — ABNORMAL LOW (ref 3.87–5.11)
RDW: 14.5 % (ref 11.5–15.5)
WBC: 10.6 10*3/uL — ABNORMAL HIGH (ref 4.0–10.5)
nRBC: 0 % (ref 0.0–0.2)

## 2022-11-05 LAB — RPR: RPR Ser Ql: NONREACTIVE

## 2022-11-05 SURGERY — Surgical Case
Anesthesia: Spinal

## 2022-11-05 MED ORDER — CEFAZOLIN SODIUM-DEXTROSE 2-3 GM-%(50ML) IV SOLR
INTRAVENOUS | Status: DC | PRN
  Administered 2022-11-05: 2 g via INTRAVENOUS

## 2022-11-05 MED ORDER — KETOROLAC TROMETHAMINE 30 MG/ML IJ SOLN
30.0000 mg | Freq: Four times a day (QID) | INTRAMUSCULAR | Status: AC
  Administered 2022-11-05 (×2): 30 mg via INTRAVENOUS
  Filled 2022-11-05 (×3): qty 1

## 2022-11-05 MED ORDER — MEASLES, MUMPS & RUBELLA VAC IJ SOLR: 0.5000 mL | Freq: Once | INTRAMUSCULAR | Status: DC

## 2022-11-05 MED ORDER — ONDANSETRON HCL 4 MG/2ML IJ SOLN
INTRAMUSCULAR | Status: DC | PRN
  Administered 2022-11-05: 4 mg via INTRAVENOUS

## 2022-11-05 MED ORDER — FENTANYL CITRATE (PF) 100 MCG/2ML IJ SOLN
INTRAMUSCULAR | Status: AC
  Filled 2022-11-05: qty 2

## 2022-11-05 MED ORDER — FENTANYL CITRATE (PF) 100 MCG/2ML IJ SOLN
100.0000 ug | Freq: Once | INTRAMUSCULAR | Status: AC
  Administered 2022-11-05: 100 ug via INTRAVENOUS
  Filled 2022-11-05: qty 2

## 2022-11-05 MED ORDER — SERTRALINE HCL 25 MG PO TABS
25.0000 mg | ORAL_TABLET | Freq: Every day | ORAL | Status: DC
  Filled 2022-11-05: qty 1

## 2022-11-05 MED ORDER — ACETAMINOPHEN 10 MG/ML IV SOLN
INTRAVENOUS | Status: AC
  Filled 2022-11-05: qty 100

## 2022-11-05 MED ORDER — FENTANYL CITRATE (PF) 100 MCG/2ML IJ SOLN
INTRAMUSCULAR | Status: DC | PRN
  Administered 2022-11-05: 15 ug via INTRATHECAL

## 2022-11-05 MED ORDER — TRANEXAMIC ACID-NACL 1000-0.7 MG/100ML-% IV SOLN
INTRAVENOUS | Status: DC | PRN
  Administered 2022-11-05: 1000 mg via INTRAVENOUS

## 2022-11-05 MED ORDER — PROPOFOL 10 MG/ML IV BOLUS
INTRAVENOUS | Status: AC
  Filled 2022-11-05: qty 20

## 2022-11-05 MED ORDER — LIDOCAINE-EPINEPHRINE (PF) 2 %-1:200000 IJ SOLN
INTRAMUSCULAR | Status: AC
  Filled 2022-11-05: qty 20

## 2022-11-05 MED ORDER — VALACYCLOVIR HCL 500 MG PO TABS
1000.0000 mg | ORAL_TABLET | Freq: Every day | ORAL | Status: DC
  Administered 2022-11-05: 1000 mg via ORAL
  Filled 2022-11-05: qty 2

## 2022-11-05 MED ORDER — SCOPOLAMINE 1 MG/3DAYS TD PT72
1.0000 | MEDICATED_PATCH | Freq: Once | TRANSDERMAL | Status: DC
  Administered 2022-11-05: 1.5 mg via TRANSDERMAL

## 2022-11-05 MED ORDER — TETANUS-DIPHTH-ACELL PERTUSSIS 5-2.5-18.5 LF-MCG/0.5 IM SUSY: 0.5000 mL | PREFILLED_SYRINGE | Freq: Once | INTRAMUSCULAR | Status: DC

## 2022-11-05 MED ORDER — SODIUM CHLORIDE 0.9% FLUSH: 3.0000 mL | INTRAVENOUS | Status: DC | PRN

## 2022-11-05 MED ORDER — OXYCODONE HCL 5 MG PO TABS
5.0000 mg | ORAL_TABLET | ORAL | Status: DC | PRN
  Administered 2022-11-05: 5 mg via ORAL
  Administered 2022-11-06 (×4): 10 mg via ORAL
  Administered 2022-11-06: 5 mg via ORAL
  Administered 2022-11-07 (×2): 10 mg via ORAL
  Filled 2022-11-05: qty 1
  Filled 2022-11-05 (×5): qty 2
  Filled 2022-11-05: qty 1
  Filled 2022-11-05: qty 2

## 2022-11-05 MED ORDER — KETOROLAC TROMETHAMINE 30 MG/ML IJ SOLN: 30.0000 mg | Freq: Four times a day (QID) | INTRAMUSCULAR | Status: DC | PRN

## 2022-11-05 MED ORDER — LACTATED RINGERS IV SOLN: INTRAVENOUS | Status: DC

## 2022-11-05 MED ORDER — SIMETHICONE 80 MG PO CHEW: 80.0000 mg | CHEWABLE_TABLET | ORAL | Status: DC | PRN

## 2022-11-05 MED ORDER — NALOXONE HCL 4 MG/10ML IJ SOLN: 1.0000 ug/kg/h | INTRAVENOUS | Status: DC | PRN

## 2022-11-05 MED ORDER — BUPIVACAINE IN DEXTROSE 0.75-8.25 % IT SOLN
INTRATHECAL | Status: DC | PRN
  Administered 2022-11-05: 1.6 mL via INTRATHECAL

## 2022-11-05 MED ORDER — ENOXAPARIN SODIUM 40 MG/0.4ML IJ SOSY
40.0000 mg | PREFILLED_SYRINGE | INTRAMUSCULAR | Status: DC
  Filled 2022-11-05 (×2): qty 0.4

## 2022-11-05 MED ORDER — WITCH HAZEL-GLYCERIN EX PADS: 1.0000 | MEDICATED_PAD | CUTANEOUS | Status: DC | PRN

## 2022-11-05 MED ORDER — DIPHENHYDRAMINE HCL 25 MG PO CAPS
25.0000 mg | ORAL_CAPSULE | Freq: Four times a day (QID) | ORAL | Status: DC | PRN
  Administered 2022-11-05: 25 mg via ORAL

## 2022-11-05 MED ORDER — PHENYLEPHRINE HCL-NACL 20-0.9 MG/250ML-% IV SOLN
INTRAVENOUS | Status: DC | PRN
  Administered 2022-11-05: 60 ug/min via INTRAVENOUS

## 2022-11-05 MED ORDER — MEDROXYPROGESTERONE ACETATE 150 MG/ML IM SUSP: 150.0000 mg | INTRAMUSCULAR | Status: DC | PRN

## 2022-11-05 MED ORDER — SENNOSIDES-DOCUSATE SODIUM 8.6-50 MG PO TABS
2.0000 | ORAL_TABLET | Freq: Every day | ORAL | Status: DC
  Administered 2022-11-06 – 2022-11-07 (×2): 2 via ORAL
  Filled 2022-11-05 (×2): qty 2

## 2022-11-05 MED ORDER — ACETAMINOPHEN 325 MG PO TABS
650.0000 mg | ORAL_TABLET | ORAL | Status: DC | PRN
  Administered 2022-11-06 (×3): 650 mg via ORAL
  Filled 2022-11-05 (×3): qty 2

## 2022-11-05 MED ORDER — MENTHOL 3 MG MT LOZG: 1.0000 | LOZENGE | OROMUCOSAL | Status: DC | PRN

## 2022-11-05 MED ORDER — IBUPROFEN 600 MG PO TABS
600.0000 mg | ORAL_TABLET | Freq: Four times a day (QID) | ORAL | Status: DC
  Administered 2022-11-05 – 2022-11-07 (×6): 600 mg via ORAL
  Filled 2022-11-05 (×6): qty 1

## 2022-11-05 MED ORDER — ONDANSETRON HCL 4 MG/2ML IJ SOLN: 4.0000 mg | Freq: Three times a day (TID) | INTRAMUSCULAR | Status: DC | PRN

## 2022-11-05 MED ORDER — ACETAMINOPHEN 500 MG PO TABS
1000.0000 mg | ORAL_TABLET | Freq: Four times a day (QID) | ORAL | Status: AC
  Administered 2022-11-05 – 2022-11-06 (×4): 1000 mg via ORAL
  Filled 2022-11-05 (×4): qty 2

## 2022-11-05 MED ORDER — DIBUCAINE (PERIANAL) 1 % EX OINT: 1.0000 | TOPICAL_OINTMENT | CUTANEOUS | Status: DC | PRN

## 2022-11-05 MED ORDER — SCOPOLAMINE 1 MG/3DAYS TD PT72
MEDICATED_PATCH | TRANSDERMAL | Status: AC
  Filled 2022-11-05: qty 1

## 2022-11-05 MED ORDER — SIMETHICONE 80 MG PO CHEW
80.0000 mg | CHEWABLE_TABLET | Freq: Three times a day (TID) | ORAL | Status: DC
  Administered 2022-11-05 – 2022-11-07 (×8): 80 mg via ORAL
  Filled 2022-11-05 (×8): qty 1

## 2022-11-05 MED ORDER — DIPHENHYDRAMINE HCL 25 MG PO CAPS
25.0000 mg | ORAL_CAPSULE | ORAL | Status: DC | PRN
  Filled 2022-11-05: qty 1

## 2022-11-05 MED ORDER — ACETAMINOPHEN 10 MG/ML IV SOLN
INTRAVENOUS | Status: DC | PRN
  Administered 2022-11-05: 1000 mg via INTRAVENOUS

## 2022-11-05 MED ORDER — NALOXONE HCL 0.4 MG/ML IJ SOLN: 0.4000 mg | INTRAMUSCULAR | Status: DC | PRN

## 2022-11-05 MED ORDER — DEXMEDETOMIDINE HCL IN NACL 200 MCG/50ML IV SOLN
INTRAVENOUS | Status: DC | PRN
  Administered 2022-11-05: 20 ug via INTRAVENOUS

## 2022-11-05 MED ORDER — KETOROLAC TROMETHAMINE 30 MG/ML IJ SOLN: 30.0000 mg | Freq: Once | INTRAMUSCULAR | Status: DC | PRN

## 2022-11-05 MED ORDER — MORPHINE SULFATE (PF) 0.5 MG/ML IJ SOLN
INTRAMUSCULAR | Status: DC | PRN
  Administered 2022-11-05: 150 ug via INTRATHECAL

## 2022-11-05 MED ORDER — PRENATAL MULTIVITAMIN CH
1.0000 | ORAL_TABLET | Freq: Every day | ORAL | Status: DC
  Administered 2022-11-05 – 2022-11-07 (×3): 1 via ORAL
  Filled 2022-11-05 (×3): qty 1

## 2022-11-05 MED ORDER — SERTRALINE HCL 25 MG PO TABS
25.0000 mg | ORAL_TABLET | Freq: Every day | ORAL | Status: DC
  Administered 2022-11-05 – 2022-11-06 (×2): 25 mg via ORAL
  Filled 2022-11-05 (×2): qty 1

## 2022-11-05 MED ORDER — DEXAMETHASONE SODIUM PHOSPHATE 10 MG/ML IJ SOLN
INTRAMUSCULAR | Status: DC | PRN
  Administered 2022-11-05: 10 mg via INTRAVENOUS

## 2022-11-05 MED ORDER — DIPHENHYDRAMINE HCL 50 MG/ML IJ SOLN
12.5000 mg | INTRAMUSCULAR | Status: DC | PRN
  Administered 2022-11-05 (×2): 12.5 mg via INTRAVENOUS
  Filled 2022-11-05 (×2): qty 1

## 2022-11-05 MED ORDER — OXYTOCIN-SODIUM CHLORIDE 30-0.9 UT/500ML-% IV SOLN
1.0000 m[IU]/min | INTRAVENOUS | Status: DC
  Administered 2022-11-05: 30 [IU] via INTRAVENOUS

## 2022-11-05 MED ORDER — IBUPROFEN 600 MG PO TABS: 600.0000 mg | ORAL_TABLET | Freq: Four times a day (QID) | ORAL | Status: DC

## 2022-11-05 MED ORDER — COCONUT OIL OIL
1.0000 | TOPICAL_OIL | Status: DC | PRN
  Administered 2022-11-06: 1 via TOPICAL

## 2022-11-05 MED ORDER — MORPHINE SULFATE (PF) 0.5 MG/ML IJ SOLN
INTRAMUSCULAR | Status: AC
  Filled 2022-11-05: qty 10

## 2022-11-05 MED ORDER — STERILE WATER FOR IRRIGATION IR SOLN
Status: DC | PRN
  Administered 2022-11-05: 1

## 2022-11-05 MED ORDER — FENTANYL CITRATE (PF) 100 MCG/2ML IJ SOLN
25.0000 ug | INTRAMUSCULAR | Status: DC | PRN
  Administered 2022-11-05: 50 ug via INTRAVENOUS

## 2022-11-05 MED ORDER — OXYTOCIN-SODIUM CHLORIDE 30-0.9 UT/500ML-% IV SOLN
2.5000 [IU]/h | INTRAVENOUS | Status: AC
  Administered 2022-11-05: 2.5 [IU]/h via INTRAVENOUS
  Filled 2022-11-05: qty 500

## 2022-11-05 SURGICAL SUPPLY — 36 items
BENZOIN TINCTURE PRP APPL 2/3 (GAUZE/BANDAGES/DRESSINGS) ×1 IMPLANT
CHLORAPREP W/TINT 26 (MISCELLANEOUS) ×2 IMPLANT
CLAMP UMBILICAL CORD (MISCELLANEOUS) ×1 IMPLANT
CLOTH BEACON ORANGE TIMEOUT ST (SAFETY) ×1 IMPLANT
DERMABOND ADVANCED .7 DNX12 (GAUZE/BANDAGES/DRESSINGS) ×1 IMPLANT
DRSG OPSITE POSTOP 4X10 (GAUZE/BANDAGES/DRESSINGS) ×1 IMPLANT
ELECT REM PT RETURN 9FT ADLT (ELECTROSURGICAL) ×1
ELECTRODE REM PT RTRN 9FT ADLT (ELECTROSURGICAL) ×1 IMPLANT
EXTRACTOR VACUUM KIWI (MISCELLANEOUS) IMPLANT
GAUZE PAD ABD 7.5X8 STRL (GAUZE/BANDAGES/DRESSINGS) IMPLANT
GAUZE SPONGE 4X4 12PLY STRL LF (GAUZE/BANDAGES/DRESSINGS) IMPLANT
GLOVE BIOGEL PI IND STRL 7.0 (GLOVE) ×3 IMPLANT
GLOVE ECLIPSE 6.5 STRL STRAW (GLOVE) ×1 IMPLANT
GOWN STRL REUS W/TWL LRG LVL3 (GOWN DISPOSABLE) ×3 IMPLANT
KIT ABG SYR 3ML LUER SLIP (SYRINGE) IMPLANT
NDL HYPO 25X5/8 SAFETYGLIDE (NEEDLE) IMPLANT
NEEDLE HYPO 25X5/8 SAFETYGLIDE (NEEDLE) IMPLANT
NS IRRIG 1000ML POUR BTL (IV SOLUTION) ×1 IMPLANT
PACK C SECTION WH (CUSTOM PROCEDURE TRAY) ×1 IMPLANT
PAD ABD 7.5X8 STRL (GAUZE/BANDAGES/DRESSINGS) ×1 IMPLANT
PAD OB MATERNITY 4.3X12.25 (PERSONAL CARE ITEMS) ×1 IMPLANT
RTRCTR C-SECT PINK 25CM LRG (MISCELLANEOUS) ×1 IMPLANT
SUT PLAIN 0 NONE (SUTURE) IMPLANT
SUT PLAIN 2 0 XLH (SUTURE) IMPLANT
SUT VIC AB 0 CT1 27 (SUTURE) ×2
SUT VIC AB 0 CT1 27XBRD ANBCTR (SUTURE) ×2 IMPLANT
SUT VIC AB 0 CTX 36 (SUTURE) ×3
SUT VIC AB 0 CTX36XBRD ANBCTRL (SUTURE) ×3 IMPLANT
SUT VIC AB 2-0 CT1 27 (SUTURE) ×1
SUT VIC AB 2-0 CT1 TAPERPNT 27 (SUTURE) ×1 IMPLANT
SUT VIC AB 3-0 SH 27 (SUTURE) ×1
SUT VIC AB 3-0 SH 27XBRD (SUTURE) ×1 IMPLANT
SUT VIC AB 4-0 KS 27 (SUTURE) ×1 IMPLANT
TOWEL OR 17X24 6PK STRL BLUE (TOWEL DISPOSABLE) ×1 IMPLANT
TRAY FOLEY W/BAG SLVR 14FR LF (SET/KITS/TRAYS/PACK) IMPLANT
WATER STERILE IRR 1000ML POUR (IV SOLUTION) ×1 IMPLANT

## 2022-11-05 NOTE — Progress Notes (Signed)
MOB was referred for history of depression/anxiety. * Referral screened out by Clinical Social Worker because none of the following criteria appear to apply: ~ History of anxiety/depression during this pregnancy, or of post-partum depression following prior delivery. ~ Diagnosis of anxiety and/or depression within last 3 years OR * MOB's symptoms currently being treated with medication and/or therapy. MOB is prescribed Zoloft '25mg'$  daily. Please contact the Clinical Social Worker if needs arise, by [facility] request, or if MOB scores greater than 9/yes to question 10 on Edinburgh Postpartum Depression Screen.  Signed,  Berniece Salines, MSW, LCSWA, LCASA 11/05/2022 3:05 PM

## 2022-11-05 NOTE — Progress Notes (Signed)
Patient ID: Victoria Hoffman, female   DOB: Jan 28, 2001, 22 y.o.   MRN: [id number]   Subjective: -Nurse call reports patient with prolonged decel.  States Dr. Kathlen Mody at bedside.   Objective: BP 137/82   Pulse 100   Temp 98.5 F (36.9 C) (Oral)   Resp 16   Ht '5\' 1"'$  (1.549 m)   Wt 88 kg   LMP 02/02/2022   SpO2 100%   BMI 36.64 kg/m  No intake/output data recorded. No intake/output data recorded.  Fetal Monitoring: FHT: 125 bpm, Mod Var, + Prolonged Decels, -Accels UC: Q7mn, palpates moderate    Vaginal Exam: SVE:   Dilation: 4 Effacement (%): 70 Station: -3 Exam by:: Dr ONelda MarseilleMembranes:Intact; Cord noted Internal Monitors: None  Augmentation/Induction: Pitocin:None Cytotec: S/P One Dose  Assessment:  IUP at 39.3 weeks Cat II FT   Plan: -Dr. ONelda Marseillestates unable to rupture membranes without descent of umbilical cord into vault aeb position.  -Discussed options including continued management and cesarean section.  -Patient opts for C/S.  -Necessary staff notified.   JRiley Churches CNM Advanced Practice Provider, [facility] 11/05/2022, 2:17 AM

## 2022-11-05 NOTE — Progress Notes (Signed)
Called to inquire if MOB can take IV Torodol if mom has history of asthma. I was informed by the PACU RN that Torodol may be contraindicated. Provider said that if MOB tolerates NSAIDS like aspirin with no negative side effects that she can take Torodol. MOB states that she has taken aspirin many times with no ill effects. Torodol is therefore safe for mom to take.

## 2022-11-05 NOTE — Transfer of Care (Signed)
Immediate Anesthesia Transfer of Care Note  Patient: Victoria Hoffman  Procedure(s) Performed: CESAREAN SECTION  Patient Location: PACU  Anesthesia Type:Spinal  Level of Consciousness: awake, alert , and oriented  Airway & Oxygen Therapy: Patient Spontanous Breathing  Post-op Assessment: Report given to RN and Post -op Vital signs reviewed and stable  Post vital signs: Reviewed and stable  Last Vitals:  Vitals Value Taken Time  BP 107/64 11/05/22 0338  Temp    Pulse 82 11/05/22 0343  Resp 16 11/05/22 0343  SpO2 95 % 11/05/22 0343  Vitals shown include unvalidated device data.  Last Pain:  Vitals:   11/05/22 0200  TempSrc:   PainSc: 5          Complications: No notable events documented.

## 2022-11-05 NOTE — Op Note (Signed)
Victoria Hoffman PROCEDURE DATE: 11/05/2022  PREOPERATIVE DIAGNOSES: Intrauterine pregnancy at 40w3dweeks gestation; malpresentation: cord presentation w/ NRFHT  POSTOPERATIVE DIAGNOSES: The same  PROCEDURE: Low Transverse Cesarean Section  SURGEON:  Dr. ONelda Marseille ASSISTANT:  Dr. AJanus Molder An experienced assistant was required given the standard of surgical care given the complexity of the case.  This assistant was needed for exposure, dissection, suctioning, retraction, instrument exchange, assisting with delivery with administration of fundal pressure, and for overall help during the procedure.  ANESTHESIOLOGY TEAM: Anesthesiologist: WFreddrick March MD CRNA: MBabs Bertin CRNA  INDICATIONS: Victoria Goudeauis a 22y.o. G1P1001 at 382w3dere for cesarean section secondary to the indications listed under preoperative diagnoses; please see preoperative note for further details.  The risks of surgery were discussed with the patient including but were not limited to: bleeding which may require transfusion or reoperation; infection which may require antibiotics; injury to bowel, bladder, ureters or other surrounding organs; injury to the fetus; need for additional procedures including hysterectomy in the event of a life-threatening hemorrhage; formation of adhesions; placental abnormalities wth subsequent pregnancies; incisional problems; thromboembolic phenomenon and other postoperative/anesthesia complications.  The patient concurred with the proposed plan, giving informed written consent for the procedure.    FINDINGS:  Viable female infant in cephalic presentation.  Apgars 8 and 9.  Clear amniotic fluid.  Intact placenta, three vessel cord.  Normal uterus, fallopian tubes and ovaries bilaterally.  ANESTHESIA: Spinal INTRAVENOUS FLUIDS: 800 ml   ESTIMATED BLOOD LOSS: 296 ml URINE OUTPUT:  400 ml SPECIMENS: Placenta sent to pathology COMPLICATIONS: None immediate  PROCEDURE IN DETAIL:  The patient  preoperatively received intravenous antibiotics and had sequential compression devices applied to her lower extremities.  She was then taken to the operating room where spinal anesthesia was administered and was found to be adequate. She was then placed in a dorsal supine position with a leftward tilt, and prepped and draped in a sterile manner.  A foley catheter was placed into her bladder and attached to constant gravity.  After an adequate timeout was performed, a Pfannenstiel skin incision was made with scalpel and carried through to the underlying layer of fascia. The fascia was incised in the midline, and this incision was extended bilaterally in a blunt fashion. Kocher clamps were applied to the superior aspect of the fascial incision and the underlying rectus muscles were dissected off bluntly.  A similar process was carried out on the inferior aspect of the fascial incision with blunt and sharp dissection with mayo scissors. The rectus muscles were separated in the midline and the peritoneum was entered bluntly. The Alexis self-retaining retractor was introduced into the abdominal cavity.  Attention was turned to the lower uterine segment where a low transverse hysterotomy was made with a scalpel and extended bilaterally bluntly.  The infant was successfully delivered, the cord was clamped and cut after one minute, and the infant was handed over to the awaiting neonatology team. Uterine massage was then administered, and the placenta delivered intact with a three-vessel cord. The uterus was then cleared of clots and debris.  The hysterotomy was closed with 0 Vicryl in a running unlocked fashion. The pelvis was cleared of all clot and debris. Hemostasis was confirmed on all surfaces.  The retractor was removed.  The peritoneum was closed with a 0 Vicryl running stitch. The fascia was then closed using 0 Vicryl x2 in a running fashion.  The subcutaneous layer was irrigated, reapproximated with 2-0 plain gut  running stitches, and the skin was closed with a 4-0 Vicryl subcuticular stitch. The patient tolerated the procedure well. Sponge, instrument and needle counts were correct x3.  She was taken to the recovery room in stable condition.   Gerlene Fee, DO OB Fellow, [place] for [place] 11/05/2022, 3:36 AM

## 2022-11-05 NOTE — Progress Notes (Signed)
Circumcision Consent  Discussed with mom at bedside about circumcision.   Circumcision is a surgery that removes the skin that covers the tip of the penis, called the "foreskin." Circumcision is usually done when a boy is between 33 and 6 days old, sometimes up to 71-55 weeks old.  The most common reasons boys are circumcised include for cultural/religious beliefs or for parental preference (potentially easier to clean, so baby looks like daddy, etc).  There may be some medical benefits for circumcision:   Circumcised boys seem to have slightly lower rates of: ? Urinary tract infections (per the American Academy of Pediatrics an uncircumcised boy has a 1/100 chance of developing a UTI in the first year of life, a circumcised boy at a 09/998 chance of developing a UTI in the first year of life- a 10% reduction) ? Penis cancer (typically rare- an uncircumcised female has a 1 in 100,000 chance of developing cancer of the penis) ? Sexually transmitted infection (in endemic areas, including HIV, HPV and Herpes- circumcision does NOT protect against gonorrhea, chlamydia, trachomatis, or syphilis) ? Phimosis: a condition where that makes retraction of the foreskin over the glans impossible (0.4 per 1000 boys per year or 0.6% of boys are affected by their 15th birthday)  Boys and men who are not circumcised can reduce these extra risks by: ? Cleaning their penis well ? Using condoms during sex  What are the risks of circumcision?  As with any surgical procedure, there are risks and complications. In circumcision, complications are rare and usually minor, the most common being: ? Bleeding- risk is reduced by holding each clamp for 30 seconds prior to a cut being made, and by holding pressure after the procedure is done ? Infection- the penis is cleaned prior to the procedure, and the procedure is done under sterile technique ? Damage to the urethra or amputation of the penis  How is circumcision done  in baby boys?  The baby will be placed on a special table and the legs restrained for their safety. Numbing medication is injected into the penis, and the skin is cleansed with betadine to decrease the risk of infection.   What to expect:  The penis will look red and raw for 5-7 days as it heals. We expect scabbing around where the cut was made, as well as clear-pink fluid and some swelling of the penis right after the procedure. If your baby's circumcision starts to bleed or develops pus, please contact your pediatrician immediately.  All questions were answered and mother consented.  Victoria Living, MD 8:36 AM

## 2022-11-05 NOTE — Progress Notes (Signed)
Patient ID: Victoria Hoffman, female   DOB: 05/26/2001, 22 y.o.   MRN: [id number]  Subjective: -Patient s/p shower.  She states she is coping well with contractions.  Using nitrous with good results. States she would like to consider fentanyl because she is tired and would like to rest.    Objective: BP 133/85   Pulse 80   Temp 98.5 F (36.9 C) (Oral)   Resp 16   Ht '5\' 1"'$  (1.549 m)   Wt 88 kg   LMP 02/02/2022   SpO2 100%   BMI 36.64 kg/m  No intake/output data recorded. No intake/output data recorded.  Fetal Monitoring: FHT: Assessing UC: Palpates moderate    Vaginal Exam: SVE:   Dilation: 4 Effacement (%): 70 Station: -3 Exam by:: Mary Martinique Johnson, RNC-OB Membranes:Intact Internal Monitors: None  Augmentation/Induction: Pitocin:None Cytotec: S/P One dose  Assessment:  IUP at 39.3 weeks  IOL for GHTN Pain Mgmt   Plan: -Okay for fentanyl as desired.  -Discussed dosing and effects. -Plan to place back on continuous monitoring in anticipation of pitocin initiation.  -Pitocin ordered and will start if cervical exam appropriate. -Continue other mgmt as ordered   Riley Churches, [place] Provider, Center for [place] 11/05/2022, 1:27 AM

## 2022-11-05 NOTE — Anesthesia Procedure Notes (Signed)
Spinal  Patient location during procedure: OR Start time: 11/05/2022 2:31 AM End time: 11/05/2022 2:35 AM Reason for block: surgical anesthesia Staffing Performed: anesthesiologist  Anesthesiologist: Freddrick March, MD Performed by: Freddrick March, MD Authorized by: Freddrick March, MD   Preanesthetic Checklist Completed: patient identified, IV checked, risks and benefits discussed, surgical consent, monitors and equipment checked, pre-op evaluation and timeout performed Spinal Block Patient position: sitting Prep: DuraPrep and site prepped and draped Patient monitoring: cardiac monitor, continuous pulse ox and blood pressure Approach: midline Location: L3-4 Injection technique: single-shot Needle Needle type: Pencan  Needle gauge: 24 G Needle length: 9 cm Assessment Sensory level: T6 Events: CSF return Additional Notes Functioning IV was confirmed and monitors were applied. Sterile prep and drape, including hand hygiene and sterile gloves were used. The patient was positioned and the spine was prepped. The skin was anesthetized with lidocaine.  Free flow of clear CSF was obtained prior to injecting local anesthetic into the CSF.  The spinal needle aspirated freely following injection.  The needle was carefully withdrawn.  The patient tolerated the procedure well.

## 2022-11-05 NOTE — Progress Notes (Signed)
Called to bedside due to non-reassuring fetal well being.  Pt repositioned, FHT improved.  SVE performed- pt still intact; however, palpable cord noted below fetal head.  Attempt was made at reduction without success.  Discussed concern with family for cord prolapse with rupture of membranes.  Additionally, suspect decels likely due to cord positioning.  Discussed that best option would to be to proceed with primary C-section for cord presentation and fetal intolerance to labor.  Risk benefits and alternatives of cesarean section were discussed with the patient including but not limited to infection, bleeding, damage to bowel , bladder and baby with the need for further surgery. Pt voiced understanding and desires to proceed. OR team notified.  Janyth Pupa, DO Attending [place], [facility] for [employer], [place]

## 2022-11-05 NOTE — Lactation Note (Signed)
This note was copied from a baby's chart. Lactation Consultation Note  Patient Name: Victoria Hoffman [id number] Date: 11/05/2022 Age:22 Reason for consult: Initial assessment;Mother's request;Term;Primapara;1st time breastfeeding  [place] student and [place] arrive for initial lactation consult and mothers request, assisted with latching in cross cradle hold on left breast with using tea cup hold. Baby sustained latching with rhythmic sucking baby still is still nursing upon [facility] and [employer] student leaving. Gave hand pump with size 21 mm flanges, spoons for spoon feeding as well as 1 pack of colostrum cups. Encouraged to continue with skin to skin, offering the breast on cue and reminded of importance to hydrate, nourish, and rest. Reminded to call [facility] and or RN with any questions or concerns as needed.   Feeding Plan: Continue to do skin to skin, offering breast on demand at least every 3 hours. 2.   Hand expression/ spoon feeding colostrum as needed after latching. 3.   Hand pumping for stimulation as needed for supplementation if desired.  4.   Call [place] with any questions, concerns, or needed support.   Maternal Data Has patient been taught Hand Expression?: No Does the patient have breastfeeding experience prior to this delivery?: No  Feeding Mother's Current Feeding Choice: Breast Milk  LATCH Score Latch: Grasps breast easily, tongue down, lips flanged, rhythmical sucking.  Audible Swallowing: Spontaneous and intermittent  Type of Nipple: Everted at rest and after stimulation  Comfort (Breast/Nipple): Soft / non-tender  Hold (Positioning): Assistance needed to correctly position infant at breast and maintain latch.  LATCH Score: 9   Lactation Tools Discussed/Used Tools: Pump;Flanges;Other (comment) Flange Size: 21 Breast pump type: Manual Reason for Pumping: Stimulation and supplementation and mothers request Pumping frequency: As needed  Interventions Interventions: Breast  feeding basics reviewed;Assisted with latch;Skin to skin;Adjust position;Support pillows;Position options;Hand pump;Expressed milk;Education  Discharge Pump: Manual;Personal WIC Program: Yes  Consult Status Consult Status: Follow-up Date: 11/06/22 Follow-up type: In-patient    Debara Pickett, [place] Lactation Student  11/05/2022, 9:14 AM

## 2022-11-05 NOTE — Anesthesia Preprocedure Evaluation (Signed)
Anesthesia Evaluation  Patient identified by MRN, date of birth, ID band Patient awake    Reviewed: Allergy & Precautions, NPO status , Patient's Chart, lab work & pertinent test results  Airway Mallampati: II  TM Distance: >3 FB Neck ROM: Full    Dental no notable dental hx.    Pulmonary asthma    Pulmonary exam normal breath sounds clear to auscultation       Cardiovascular negative cardio ROS Normal cardiovascular exam Rhythm:Regular Rate:Normal     Neuro/Psych  Headaches PSYCHIATRIC DISORDERS Anxiety        GI/Hepatic Neg liver ROS,GERD  ,,  Endo/Other  negative endocrine ROS    Renal/GU negative Renal ROS  negative genitourinary   Musculoskeletal negative musculoskeletal ROS (+)    Abdominal   Peds  Hematology negative hematology ROS (+)   Anesthesia Other Findings Primary C/S for fetal intolerance to labor and cord presentation  Reproductive/Obstetrics (+) Pregnancy                             Anesthesia Physical Anesthesia Plan  ASA: 2 and emergent  Anesthesia Plan: Spinal   Post-op Pain Management:    Induction:   PONV Risk Score and Plan: Treatment may vary due to age or medical condition  Airway Management Planned: Natural Airway  Additional Equipment:   Intra-op Plan:   Post-operative Plan:   Informed Consent: I have reviewed the patients History and Physical, chart, labs and discussed the procedure including the risks, benefits and alternatives for the proposed anesthesia with the patient or authorized representative who has indicated his/her understanding and acceptance.       Plan Discussed with: Anesthesiologist  Anesthesia Plan Comments: (Patient identified. Risks, benefits, options discussed with patient including but not limited to bleeding, infection, nerve damage, paralysis, failed block, incomplete pain control, headache, blood pressure changes,  nausea, vomiting, reactions to medication, itching, and post partum back pain. Confirmed with bedside nurse the patient's most recent platelet count. Confirmed with the patient that they are not taking any anticoagulation, have any bleeding history or any family history of bleeding disorders. Patient expressed understanding and wishes to proceed. All questions were answered. )       Anesthesia Quick Evaluation

## 2022-11-05 NOTE — Lactation Note (Addendum)
This note was copied from a baby's chart. Lactation Consultation Note  Patient Name: Victoria Hoffman [id number] Date: 11/05/2022 Age:23 hours Reason for consult: Follow-up assessment;Difficult latch;Mother's request;Term;1st time breastfeeding (C/S delivery, See Birth Parent's -MR) Infant had 3 stools since birth.  Birth Parent requested [place] services for latch assistance, Birth Parent pre-pump breast  with hand pump and did hand expression to help evert nipple out more prior to latching infant at the breast. Infant was on and off the breast for 15 minutes, Birth Parent latched infant on her right breast using the football hold position, and afterwards received 4 mls of colostrum by spoon.   Birth Parent is very tired and briefly went to sleep during infant's feeding, LC suggested maternal rest which is important, reviewed diet and hydration.   Birth Parent will continue to work towards latching infant at the breast, feeding infant by hunger cues, on demand, 8 to 12 + times within 24 hours,  and continue to ask RN/LC for latch assistance if needed. Maternal Data Has patient been taught Hand Expression?: Yes  Feeding Mother's Current Feeding Choice: Breast Milk  LATCH Score Latch: Repeated attempts needed to sustain latch, nipple held in mouth throughout feeding, stimulation needed to elicit sucking reflex.  Audible Swallowing: A few with stimulation  Type of Nipple: Everted at rest and after stimulation (Birth Parent is short shafted)  Comfort (Breast/Nipple): Soft / non-tender  Hold (Positioning): Assistance needed to correctly position infant at breast and maintain latch.  LATCH Score: 7   Lactation Tools Discussed/Used    Interventions Interventions: Support pillows;Position options;Skin to skin;Assisted with latch;Breast compression;Breast massage;Reverse pressure;Education;Hand express  Discharge    Consult Status Consult Status: Follow-up Date: 11/06/22 Follow-up type:  In-patient    Eulis Canner 11/05/2022, 7:28 PM

## 2022-11-05 NOTE — Discharge Summary (Addendum)
Postpartum Discharge Summary  Date of Service updated***     Patient Name: Victoria Hoffman DOB: Jan 11, 2001 MRN: [id number]  Date of admission: 11/04/2022 Delivery date:11/05/2022  Delivering provider: Janyth Pupa  Date of discharge: 11/05/2022  Admitting diagnosis: Indication for care in labor or delivery [O75.9] Intrauterine pregnancy: [redacted]w[redacted]d    Secondary diagnosis:  Principal Problem:   Indication for care in labor or delivery  Additional problems: ***    Discharge diagnosis: {DX.:23714}                                              Post partum procedures:{Postpartum procedures:23558} Augmentation: Cytotec and IP Foley Complications: NRFHT 2/2 Cord presentation  Hospital course: Induction of Labor With Cesarean Section   22y.o. yo G1P1001 at 324w3das admitted to the hospital 11/04/2022 for induction of labor. Patient had a labor course significant for cord presentation with intact membrane without improvement following maternal positioning. The patient went for cesarean section due to Malpresentation and Non-Reassuring FHR. Delivery details are as follows: Membrane Rupture Time/Date: 2:53 AM ,11/05/2022   Delivery Method:C-Section, Low Transverse  Details of operation can be found in separate operative Note.  Patient had a postpartum course complicated by***. She is ambulating, tolerating a regular diet, passing flatus, and urinating well.  Patient is discharged home in stable condition on 11/05/22.      Newborn Data: Birth date:11/05/2022  Birth time:2:53 AM  Gender:Female  Living status:Living  Apgars:8 ,9  Weight:                                Magnesium Sulfate received: No BMZ received: No Rhophylac:No MMR:{MMR:30440033} T-DaP:{Tdap:23962} Flu: {FWG:1132360ransfusion:{Transfusion received:30440034}  Physical exam  Vitals:   11/04/22 2249 11/05/22 0022 11/05/22 0213 11/05/22 0338  BP: 137/76 133/85 137/82 107/64  Pulse: 95 80 100 87  Resp:  16  19  Temp:  98.5 F  (36.9 C)    TempSrc:  Oral    SpO2:    98%  Weight:      Height:       General: {Exam; general:21111117} Lochia: {Desc; appropriate/inappropriate:30686::"appropriate"} Uterine Fundus: {Desc; firm/soft:30687} Incision: {Exam; incision:21111123} DVT Evaluation: {Exam; dvt:2111122} Labs: Lab Results  Component Value Date   WBC 8.8 11/04/2022   HGB 12.5 11/04/2022   HCT 37.6 11/04/2022   MCV 91.3 11/04/2022   PLT 191 11/04/2022      Latest Ref Rng & Units 11/04/2022    7:00 PM  CMP  Glucose 70 - 99 mg/dL 75   BUN 6 - 20 mg/dL 5   Creatinine 0.44 - 1.00 mg/dL 0.75   Sodium 135 - 145 mmol/L 135   Potassium 3.5 - 5.1 mmol/L 3.7   Chloride 98 - 111 mmol/L 103   CO2 22 - 32 mmol/L 21   Calcium 8.9 - 10.3 mg/dL 9.2   Total Protein 6.5 - 8.1 g/dL 7.0   Total Bilirubin 0.3 - 1.2 mg/dL 0.3   Alkaline Phos 38 - 126 U/L 190   AST 15 - 41 U/L 22   ALT 0 - 44 U/L 13    Edinburgh Score:     No data to display           After visit meds:  Allergies as of 11/05/2022  Reactions   Flagyl [metronidazole] Nausea And Vomiting   Wheat Bran Rash     Med Rec must be completed prior to using this [place]***        Discharge home in stable condition Infant Feeding: {Baby feeding:23562} Infant Disposition:{CHL IP OB HOME WITH DX:[id number] Discharge instruction: per After Visit Summary and Postpartum booklet. Activity: Advance as tolerated. Pelvic rest for 6 weeks.  Diet: {OB BY:630183 Future Appointments: Future Appointments  Date Time Provider [place]  11/08/2022  2:35 PM Helaine Chess [facility]  11/15/2022  9:15 AM Gabriel Carina, CNM [facility] [place]  11/15/2022 10:15 AM [facility]   Follow up Visit:  Message sent to [facility] by Autry-Lott on 11/05/2022  Please schedule this patient for a In person postpartum visit in 6 weeks with the following provider:  Gaylan Gerold CNM . Additional Postpartum F/U:BP check 1 week  High risk  pregnancy complicated by:  gHTN Delivery mode:  C-Section, Low Transverse  Anticipated Birth Control:  Condoms   11/05/2022 Simone Autry-Lott, DO

## 2022-11-05 NOTE — Lactation Note (Signed)
This note was copied from a baby's chart. Lactation Consultation Note  Patient Name: Victoria Hoffman [id number] Date: 11/05/2022 Age:23 hours Reason for consult: Mother's request;Follow-up assessment;Difficult latch  LC student arrived to provide latch assistance per [facility] stating mom called out for assistance. [facility] student assisted positioning baby in the football hold on right breast, adjusting bed, pillows, baby skin to skin, and using tea cup hold to facilitate deep latch. After several attempts of baby not latching deeply, [place] student did suck training on gloved finger and was noted sucking was well coordinated, after that, was able to sustain a deep latch with rhythmic sucking and nurse offered stethoscope option to check for swallows. Nurse noted swallows and offered stethoscope to [facility] student and [place] student was able to note multiple swallows as well. Baby continued to latch for a total of 20 minutes then baby fell asleep and ended feeding. [place] student changed 1 large meconium dirty diaper that was loose and soft, and black/brown in appearance. [place] student then placed baby with maternal grandfather skin to skin and shared the benefits of skin to skin for both grandfather and baby while mom ordered lunch with plans to nap until lunch arrives.  Praised mom for making strides in her breastfeeding journey, good positioning, and doing a great job continuing to provide colostrum and latching baby on cue.  Plans to continue with previous stated feeding plan.    Maternal Data Has patient been taught Hand Expression?: No Does the patient have breastfeeding experience prior to this delivery?: No  Feeding Mother's Current Feeding Choice: Breast Milk  LATCH Score Latch: Repeated attempts needed to sustain latch, nipple held in mouth throughout feeding, stimulation needed to elicit sucking reflex.  Audible Swallowing: Spontaneous and intermittent  Type of Nipple: Everted at rest and after stimulation (Nipples  appear to have minimal protrusion at this time)  Comfort (Breast/Nipple): Soft / non-tender  Hold (Positioning): Assistance needed to correctly position infant at breast and maintain latch.  LATCH Score: 8     Interventions Interventions: Breast feeding basics reviewed;Assisted with latch;Skin to skin;Breast massage;Hand express;Breast compression;Adjust position;Support pillows;Position options;Education  Discharge [place] Program: Yes  Consult Status Consult Status: Follow-up Date: 11/06/22 Follow-up type: In-patient    Debara Pickett, BSW, Lactation Student.  11/05/2022, 1:32 PM

## 2022-11-06 MED ORDER — BENZOCAINE-MENTHOL 20-0.5 % EX AERO
1.0000 | INHALATION_SPRAY | Freq: Four times a day (QID) | CUTANEOUS | Status: DC | PRN
  Administered 2022-11-06: 1 via TOPICAL
  Filled 2022-11-06: qty 56

## 2022-11-06 NOTE — Anesthesia Postprocedure Evaluation (Signed)
Anesthesia Post Note  Patient: Primary school teacher  Procedure(s) Performed: CESAREAN SECTION     Patient location during evaluation: PACU Anesthesia Type: Spinal Level of consciousness: oriented and awake and alert Pain management: pain level controlled Vital Signs Assessment: post-procedure vital signs reviewed and stable Respiratory status: spontaneous breathing, respiratory function stable and patient connected to nasal cannula oxygen Cardiovascular status: blood pressure returned to baseline and stable Postop Assessment: no headache, no backache and no apparent nausea or vomiting Anesthetic complications: no  No notable events documented.  Last Vitals:  Vitals:   11/06/22 0608 11/06/22 1652  BP: 121/62 127/72  Pulse: 71 85  Resp: 17 20  Temp: 36.4 C 36.8 C  SpO2: 98% 97%    Last Pain:  Vitals:   11/06/22 1930  TempSrc:   PainSc: 0-No pain                 Azaria Bartell L Jemal Miskell

## 2022-11-06 NOTE — Progress Notes (Signed)
POSTPARTUM PROGRESS NOTE  POD #1  Subjective:  Victoria Hoffman is a 22 y.o. G1P1001 s/p pLTCS at [redacted]w[redacted]d  She reports she doing well. No acute events overnight. She reports she is doing well. She denies any problems with ambulating, voiding or po intake. Denies nausea or vomiting. She has passed flatus. Pain is moderately controlled.  Lochia is appropriate.  Objective: Blood pressure 121/62, pulse 71, temperature 97.6 F (36.4 C), temperature source Oral, resp. rate 17, height '5\' 1"'$  (1.549 m), weight 88 kg, last menstrual period 02/02/2022, SpO2 98 %, unknown if currently breastfeeding.  Physical Exam:  General: alert, cooperative and no distress Chest: no respiratory distress Heart:regular rate, distal pulses intact Abdomen: soft, nontender,  Uterine Fundus: firm, appropriately tender DVT Evaluation: No calf swelling or tenderness Extremities: no edema Skin: warm, dry; incision clean/dry/intact w/ pressure dressing in place  Recent Labs    11/04/22 1900 11/05/22 0525  HGB 12.5 10.9*  HCT 37.6 32.4*    Assessment/Plan: MNoreli Freymanis a 22y.o. G1P1001 s/p pLTCS at 317w3dor cord presentation.  POD#1 - Doing welll; pain moderately controlled. H/H appropriate  Routine postpartum care  OOB, ambulated  Lovenox for VTE prophylaxis Anemia: asymptomatic  Start po ferrous sulfate BID  Contraception: declines Feeding: breast  Dispo: Plan for discharge tomorrow or the next day .   LOS: 2 days   ViGifford ShaveMD  OB Fellow  11/06/2022, 8:55 AM

## 2022-11-06 NOTE — Lactation Note (Signed)
This note was copied from a baby's chart. Lactation Consultation Note  Patient Name: Victoria Hoffman [id number] Date: 11/06/2022 Age:23 hours Reason for consult: Follow-up assessment;1st time breastfeeding  P1, Per mother baby was fussy last night and baby had difficulty sustaining latch.  Baby was supplemented with 26 ml of formula.  Assisted baby with latching.  Reviewed waking techniques.  Had mother hand express and prepump with manual pump.  After a few attempts baby was able to sustain latch for more than 10 min in football hold on L breast.  DEBP is set up and mother states she does not need review.  Discussed cluster feeding.  Encouraged skin to skin if baby is not latching.  Maternal Data Has patient been taught Hand Expression?: Yes Does the patient have breastfeeding experience prior to this delivery?: No  Feeding Mother's Current Feeding Choice: Breast Milk and Formula Nipple Type: Extra Slow Flow  LATCH Score Latch: Repeated attempts needed to sustain latch, nipple held in mouth throughout feeding, stimulation needed to elicit sucking reflex.  Audible Swallowing: A few with stimulation  Type of Nipple: Everted at rest and after stimulation  Comfort (Breast/Nipple): Soft / non-tender  Hold (Positioning): Assistance needed to correctly position infant at breast and maintain latch.  LATCH Score: 7   Lactation Tools Discussed/Used    Interventions Interventions: Breast feeding basics reviewed;Assisted with latch;Skin to skin;Hand express;Pre-pump if needed;Adjust position;Support pillows;DEBP;Hand pump;Education  Discharge    Consult Status Consult Status: Follow-up Date: 11/07/22 Follow-up type: In-patient    Vivianne Master [facility] 11/06/2022, 8:58 AM

## 2022-11-07 ENCOUNTER — Other Ambulatory Visit (HOSPITAL_COMMUNITY): Payer: Self-pay

## 2022-11-07 LAB — SURGICAL PATHOLOGY

## 2022-11-07 MED ORDER — OXYCODONE HCL 5 MG PO TABS
5.0000 mg | ORAL_TABLET | ORAL | 0 refills | Status: DC | PRN
  Filled 2022-11-07: qty 20, 5d supply, fill #0

## 2022-11-07 MED ORDER — ACETAMINOPHEN 325 MG PO TABS
650.0000 mg | ORAL_TABLET | ORAL | 0 refills | Status: DC | PRN
  Filled 2022-11-07: qty 30, 3d supply, fill #0

## 2022-11-07 MED ORDER — IBUPROFEN 600 MG PO TABS
600.0000 mg | ORAL_TABLET | Freq: Four times a day (QID) | ORAL | 0 refills | Status: DC
  Filled 2022-11-07: qty 30, 8d supply, fill #0

## 2022-11-07 NOTE — Plan of Care (Signed)
Problem: Education: Goal: Knowledge of Childbirth will improve 11/07/2022 1408 by Rodolph Bong, LPN Outcome: Adequate for Discharge 11/07/2022 1344 by Rodolph Bong, LPN Outcome: Progressing Goal: Ability to make informed decisions regarding treatment and plan of care will improve 11/07/2022 1408 by Rodolph Bong, LPN Outcome: Adequate for Discharge 11/07/2022 1344 by Rodolph Bong, LPN Outcome: Progressing Goal: Ability to state and carry out methods to decrease the pain will improve 11/07/2022 1408 by Rodolph Bong, LPN Outcome: Adequate for Discharge 11/07/2022 1344 by Rodolph Bong, LPN Outcome: Progressing Goal: Individualized Educational Video(s) 11/07/2022 1408 by Rodolph Bong, LPN Outcome: Adequate for Discharge 11/07/2022 1344 by Rodolph Bong, LPN Outcome: Progressing   Problem: Coping: Goal: Ability to verbalize concerns and feelings about labor and delivery will improve 11/07/2022 1408 by Rodolph Bong, LPN Outcome: Adequate for Discharge 11/07/2022 1344 by Rodolph Bong, LPN Outcome: Progressing   Problem: Life Cycle: Goal: Ability to make normal progression through stages of labor will improve 11/07/2022 1408 by Rodolph Bong, LPN Outcome: Adequate for Discharge 11/07/2022 1344 by Rodolph Bong, LPN Outcome: Progressing Goal: Ability to effectively push during vaginal delivery will improve 11/07/2022 1408 by Rodolph Bong, LPN Outcome: Adequate for Discharge 11/07/2022 1344 by Rodolph Bong, LPN Outcome: Progressing   Problem: Role Relationship: Goal: Will demonstrate positive interactions with the child 11/07/2022 1408 by Rodolph Bong, LPN Outcome: Adequate for Discharge 11/07/2022 1344 by Rodolph Bong, LPN Outcome: Progressing   Problem: Safety: Goal: Risk of complications during labor and delivery will decrease 11/07/2022 1408 by Rodolph Bong, LPN Outcome: Adequate for Discharge 11/07/2022 1344 by Rodolph Bong, LPN Outcome: Progressing   Problem: Pain  Management: Goal: Relief or control of pain from uterine contractions will improve 11/07/2022 1408 by Rodolph Bong, LPN Outcome: Adequate for Discharge 11/07/2022 1344 by Rodolph Bong, LPN Outcome: Progressing   Problem: Education: Goal: Knowledge of General Education information will improve Description: Including pain rating scale, medication(s)/side effects and non-pharmacologic comfort measures 11/07/2022 1408 by Rodolph Bong, LPN Outcome: Adequate for Discharge 11/07/2022 1344 by Rodolph Bong, LPN Outcome: Progressing   Problem: Health Behavior/Discharge Planning: Goal: Ability to manage health-related needs will improve 11/07/2022 1408 by Rodolph Bong, LPN Outcome: Adequate for Discharge 11/07/2022 1344 by Rodolph Bong, LPN Outcome: Progressing   Problem: Clinical Measurements: Goal: Ability to maintain clinical measurements within normal limits will improve 11/07/2022 1408 by Rodolph Bong, LPN Outcome: Adequate for Discharge 11/07/2022 1344 by Rodolph Bong, LPN Outcome: Progressing Goal: Will remain free from infection 11/07/2022 1408 by Rodolph Bong, LPN Outcome: Adequate for Discharge 11/07/2022 1344 by Rodolph Bong, LPN Outcome: Progressing Goal: Diagnostic test results will improve 11/07/2022 1408 by Rodolph Bong, LPN Outcome: Adequate for Discharge 11/07/2022 1344 by Rodolph Bong, LPN Outcome: Progressing Goal: Respiratory complications will improve 11/07/2022 1408 by Rodolph Bong, LPN Outcome: Adequate for Discharge 11/07/2022 1344 by Rodolph Bong, LPN Outcome: Progressing Goal: Cardiovascular complication will be avoided 11/07/2022 1408 by Rodolph Bong, LPN Outcome: Adequate for Discharge 11/07/2022 1344 by Rodolph Bong, LPN Outcome: Progressing   Problem: Coping: Goal: Level of anxiety will decrease 11/07/2022 1408 by Rodolph Bong, LPN Outcome: Adequate for Discharge 11/07/2022 1344 by Rodolph Bong, LPN Outcome: Progressing   Problem:  Elimination: Goal: Will not experience complications related to bowel motility 11/07/2022 1408 by Rodolph Bong, LPN Outcome: Adequate for Discharge 11/07/2022  1344 by Rodolph Bong, LPN Outcome: Progressing   Problem: Pain Managment: Goal: General experience of comfort will improve 11/07/2022 1408 by Rodolph Bong, LPN Outcome: Adequate for Discharge 11/07/2022 1344 by Rodolph Bong, LPN Outcome: Progressing   Problem: Safety: Goal: Ability to remain free from injury will improve 11/07/2022 1408 by Rodolph Bong, LPN Outcome: Adequate for Discharge 11/07/2022 1344 by Rodolph Bong, LPN Outcome: Progressing   Problem: Skin Integrity: Goal: Risk for impaired skin integrity will decrease 11/07/2022 1408 by Rodolph Bong, LPN Outcome: Adequate for Discharge 11/07/2022 1344 by Rodolph Bong, LPN Outcome: Progressing   Problem: Education: Goal: Knowledge of the prescribed therapeutic regimen will improve 11/07/2022 1408 by Rodolph Bong, LPN Outcome: Adequate for Discharge 11/07/2022 1344 by Rodolph Bong, LPN Outcome: Progressing Goal: Understanding of sexual limitations or changes related to disease process or condition will improve 11/07/2022 1408 by Rodolph Bong, LPN Outcome: Adequate for Discharge 11/07/2022 1344 by Rodolph Bong, LPN Outcome: Progressing Goal: Individualized Educational Video(s) 11/07/2022 1408 by Rodolph Bong, LPN Outcome: Adequate for Discharge 11/07/2022 1344 by Rodolph Bong, LPN Outcome: Progressing   Problem: Self-Concept: Goal: Communication of feelings regarding changes in body function or appearance will improve 11/07/2022 1408 by Rodolph Bong, LPN Outcome: Adequate for Discharge 11/07/2022 1344 by Rodolph Bong, LPN Outcome: Progressing   Problem: Skin Integrity: Goal: Demonstration of wound healing without infection will improve 11/07/2022 1408 by Rodolph Bong, LPN Outcome: Adequate for Discharge 11/07/2022 1344 by Rodolph Bong, LPN Outcome:  Progressing   Problem: Education: Goal: Knowledge of condition will improve 11/07/2022 1408 by Rodolph Bong, LPN Outcome: Adequate for Discharge 11/07/2022 1344 by Rodolph Bong, LPN Outcome: Progressing Goal: Individualized Educational Video(s) 11/07/2022 1408 by Rodolph Bong, LPN Outcome: Adequate for Discharge 11/07/2022 1344 by Rodolph Bong, LPN Outcome: Progressing Goal: Individualized Newborn Educational Video(s) 11/07/2022 1408 by Rodolph Bong, LPN Outcome: Adequate for Discharge 11/07/2022 1344 by Rodolph Bong, LPN Outcome: Progressing   Problem: Activity: Goal: Will verbalize the importance of balancing activity with adequate rest periods 11/07/2022 1408 by Rodolph Bong, LPN Outcome: Adequate for Discharge 11/07/2022 1344 by Serena Croissant A, LPN Outcome: Progressing Goal: Ability to tolerate increased activity will improve 11/07/2022 1408 by Rodolph Bong, LPN Outcome: Adequate for Discharge 11/07/2022 1344 by Rodolph Bong, LPN Outcome: Progressing   Problem: Coping: Goal: Ability to identify and utilize available resources and services will improve 11/07/2022 1408 by Rodolph Bong, LPN Outcome: Adequate for Discharge 11/07/2022 1344 by Rodolph Bong, LPN Outcome: Progressing   Problem: Life Cycle: Goal: Chance of risk for complications during the postpartum period will decrease 11/07/2022 1408 by Rodolph Bong, LPN Outcome: Adequate for Discharge 11/07/2022 1344 by Rodolph Bong, LPN Outcome: Progressing   Problem: Role Relationship: Goal: Ability to demonstrate positive interaction with newborn will improve 11/07/2022 1408 by Rodolph Bong, LPN Outcome: Adequate for Discharge 11/07/2022 1344 by Rodolph Bong, LPN Outcome: Progressing   Problem: Skin Integrity: Goal: Demonstration of wound healing without infection will improve 11/07/2022 1408 by Rodolph Bong, LPN Outcome: Adequate for Discharge 11/07/2022 1344 by Rodolph Bong, LPN Outcome: Progressing

## 2022-11-07 NOTE — Lactation Note (Signed)
This note was copied from a baby's chart. Lactation Consultation Note  Patient Name: Victoria Hoffman [id number] Date: 11/07/2022 Age:23 hours Reason for consult: Follow-up assessment;[place]ap[place];1st time breastfeeding;Infant weight loss;MD order;Mother's request;Term;Breastfeeding assistance;Nipple pain/trauma (4 % weight loss, milk coming. baby wide awake and ready to feed.) LC offered assistance and mom receptive . Baby wide awake and rooting.  LC noted areola edema , and mom hand express, reverse pressure and the areola more compressible. LC noted a small positional strip.  Baby latched easily and LC showed mom how to flip the upper lip and latch the baby deep. Baby fed for 12 mins with multiple swallows, increased with breast compressions. Latch score 8 - baby fed 12 mins  LC reviewed BF basics , BF D/C teaching, and per mom plans to F/U with [facility] clinic if needed.   Maternal Data    Feeding Mother's Current Feeding Choice: Breast Milk and Formula Nipple Type: Extra Slow Flow  LATCH Score Latch: Grasps breast easily, tongue down, lips flanged, rhythmical sucking.  Audible Swallowing: Spontaneous and intermittent  Type of Nipple: Everted at rest and after stimulation (areola edema , had mom use the reverse pressure exercise 1st and the areola more compressible)  Comfort (Breast/Nipple): Filling, red/small blisters or bruises, mild/mod discomfort  Hold (Positioning): Assistance needed to correctly position infant at breast and maintain latch.  LATCH Score: 8   Lactation Tools Discussed/Used Tools: Pump;Flanges Flange Size: 21 Breast pump type: Double-Electric Breast Pump;Manual Pump Education: Milk Storage;Setup, frequency, and cleaning  Interventions Interventions: Breast feeding basics reviewed;Assisted with latch;Skin to skin;Breast massage;Hand express;Reverse pressure;Breast compression;Adjust position;Support pillows;Position options;Hand pump;DEBP;Education;LC Services  brochure  Discharge Discharge Education: Engorgement and breast care;Warning signs for feeding baby;Other (comment) (per mom plans on F/U with [facility] clinic at [facility]) Pump: Personal;Manual;DEBP WIC Program: Yes  Consult Status Consult Status: Complete Date: 11/07/22    Myer Haff 11/07/2022, 1:38 PM

## 2022-11-07 NOTE — Plan of Care (Signed)
  Problem: Education: Goal: Knowledge of Childbirth will improve Outcome: Progressing Goal: Ability to make informed decisions regarding treatment and plan of care will improve Outcome: Progressing Goal: Ability to state and carry out methods to decrease the pain will improve Outcome: Progressing Goal: Individualized Educational Video(s) Outcome: Progressing   Problem: Coping: Goal: Ability to verbalize concerns and feelings about labor and delivery will improve Outcome: Progressing   Problem: Life Cycle: Goal: Ability to make normal progression through stages of labor will improve Outcome: Progressing Goal: Ability to effectively push during vaginal delivery will improve Outcome: Progressing   Problem: Role Relationship: Goal: Will demonstrate positive interactions with the child Outcome: Progressing   Problem: Safety: Goal: Risk of complications during labor and delivery will decrease Outcome: Progressing   Problem: Pain Management: Goal: Relief or control of pain from uterine contractions will improve Outcome: Progressing   Problem: Education: Goal: Knowledge of General Education information will improve Description: Including pain rating scale, medication(s)/side effects and non-pharmacologic comfort measures Outcome: Progressing   Problem: Health Behavior/Discharge Planning: Goal: Ability to manage health-related needs will improve Outcome: Progressing   Problem: Clinical Measurements: Goal: Ability to maintain clinical measurements within normal limits will improve Outcome: Progressing Goal: Will remain free from infection Outcome: Progressing Goal: Diagnostic test results will improve Outcome: Progressing Goal: Respiratory complications will improve Outcome: Progressing Goal: Cardiovascular complication will be avoided Outcome: Progressing   Problem: Coping: Goal: Level of anxiety will decrease Outcome: Progressing   Problem: Elimination: Goal: Will  not experience complications related to bowel motility Outcome: Progressing   Problem: Pain Managment: Goal: General experience of comfort will improve Outcome: Progressing   Problem: Safety: Goal: Ability to remain free from injury will improve Outcome: Progressing   Problem: Skin Integrity: Goal: Risk for impaired skin integrity will decrease Outcome: Progressing   Problem: Education: Goal: Knowledge of the prescribed therapeutic regimen will improve Outcome: Progressing Goal: Understanding of sexual limitations or changes related to disease process or condition will improve Outcome: Progressing Goal: Individualized Educational Video(s) Outcome: Progressing   Problem: Self-Concept: Goal: Communication of feelings regarding changes in body function or appearance will improve Outcome: Progressing   Problem: Skin Integrity: Goal: Demonstration of wound healing without infection will improve Outcome: Progressing   Problem: Education: Goal: Knowledge of condition will improve Outcome: Progressing Goal: Individualized Educational Video(s) Outcome: Progressing Goal: Individualized Newborn Educational Video(s) Outcome: Progressing   Problem: Activity: Goal: Will verbalize the importance of balancing activity with adequate rest periods Outcome: Progressing Goal: Ability to tolerate increased activity will improve Outcome: Progressing   Problem: Coping: Goal: Ability to identify and utilize available resources and services will improve Outcome: Progressing   Problem: Life Cycle: Goal: Chance of risk for complications during the postpartum period will decrease Outcome: Progressing   Problem: Role Relationship: Goal: Ability to demonstrate positive interaction with newborn will improve Outcome: Progressing   Problem: Skin Integrity: Goal: Demonstration of wound healing without infection will improve Outcome: Progressing

## 2022-11-08 ENCOUNTER — Encounter: Payer: BC Managed Care – PPO | Admitting: Certified Nurse Midwife

## 2022-11-13 ENCOUNTER — Other Ambulatory Visit: Payer: Self-pay

## 2022-11-13 ENCOUNTER — Ambulatory Visit (INDEPENDENT_AMBULATORY_CARE_PROVIDER_SITE_OTHER): Payer: BC Managed Care – PPO

## 2022-11-13 VITALS — BP 108/77 | HR 80 | Wt 171.8 lb

## 2022-11-13 DIAGNOSIS — Z0130 Encounter for examination of blood pressure without abnormal findings: Secondary | ICD-10-CM

## 2022-11-13 NOTE — Progress Notes (Unsigned)
Blood Pressure Check Visit  Victoria Hoffman is here for blood pressure check following primary c-section on ***. BP today is ***. Patient denies/endorses any {Symptoms; hypertension cc:11129:s:"dizzness","blurred vision","headache","shortness of breath","peripheral edema"}. Reviewed with ***.  108/77 [address] [place] 11/13/2022  10:25 AM

## 2022-11-15 ENCOUNTER — Encounter: Payer: Self-pay | Admitting: Certified Nurse Midwife

## 2022-11-15 ENCOUNTER — Other Ambulatory Visit: Payer: Self-pay

## 2022-12-05 ENCOUNTER — Other Ambulatory Visit: Payer: Self-pay

## 2022-12-05 ENCOUNTER — Ambulatory Visit (INDEPENDENT_AMBULATORY_CARE_PROVIDER_SITE_OTHER): Payer: BC Managed Care – PPO

## 2022-12-05 VITALS — BP 111/70 | HR 58

## 2022-12-05 DIAGNOSIS — Z4889 Encounter for other specified surgical aftercare: Secondary | ICD-10-CM

## 2022-12-05 DIAGNOSIS — G8918 Other acute postprocedural pain: Secondary | ICD-10-CM | POA: Diagnosis not present

## 2022-12-05 MED ORDER — GABAPENTIN 100 MG PO CAPS
100.0000 mg | ORAL_CAPSULE | Freq: Two times a day (BID) | ORAL | 0 refills | Status: AC
Start: 2022-12-05 — End: ?

## 2022-12-05 MED ORDER — IBUPROFEN 600 MG PO TABS
600.0000 mg | ORAL_TABLET | Freq: Four times a day (QID) | ORAL | 0 refills | Status: DC | PRN
Start: 2022-12-05 — End: 2022-12-20

## 2022-12-05 NOTE — Progress Notes (Signed)
Incision Check Visit  Victoria Hoffman is here for incision check following primary c-section on 11/05/22. Pt reports pain worsened approx 1 week ago. Does report increased activity in that time. Continues to take ibuprofen every 6 hours. Incision is open to air, appears clean and dry. Single suture visible. Small open area to right of incision. Dione Plover, MD to bedside for assessment. See provider note.  Patient will follow up at [facility] visit in 2 weeks.  Annabell Howells, RN 12/05/2022  12:04 PM

## 2022-12-05 NOTE — Progress Notes (Signed)
In to see patient for concern regarding incision. Patient reports she's having a lot of pain. Was feeling good at two weeks so resumed full, unrestricted activities. Since then has been having more pain.   On exam incision looks great, there is a minute area of opening in the R lateral third with some vicryl visible, this was cut and both ends retracted into the skin with patient reporting improvement in symptoms. On the L lateral third of the incision was a small area of granulation tissue with scant discharge but no surrounding erythema/induration/warmth. No concern for infection, but offered and patient accepted silver nitrate to help area heal more quickly which was applied without any complications.   Reassured patient that incision looks well and that each patient's individual experience of pain and recovery is different after cesarean. Can trial gabapentin 100 mg BID for the next month, continue ibuprofen/tylenol, and hopefully she will be feeling much better soon.   Return to clinic PRN if she has any new symptoms or worsening pain.   Clarnce Flock, MD/MPH Attending Family Medicine Physician, [facility], [place]

## 2022-12-19 NOTE — Progress Notes (Signed)
Postpartum Visit Note  Victoria Hoffman is a 22 y.o. G49P1001 female who presents for a postpartum visit. She is 6 weeks 2 days postpartum following a primary cesarean section.  I have fully reviewed the prenatal and intrapartum course. The delivery was at [redacted]w[redacted]d gestational weeks.  Anesthesia: spinal. Postpartum course has been uncomplicated. Baby is doing well. Baby is feeding by Kendamil formula. Bleeding: stopped bleeding 2-3 weeks ago, then began bleeding again last week. Bowel function is normal. Bladder function is normal. Patient is not sexually active. Contraception method is abstinence. Postpartum depression screening: negative.    The pregnancy intention screening data noted above was reviewed. Potential methods of contraception were discussed. The patient elected to proceed with Abstinence.   Edinburgh Postnatal Depression Scale - 12/20/22 1444       Edinburgh Postnatal Depression Scale:  In the Past 7 Days   I have been able to laugh and see the funny side of things. 0    I have looked forward with enjoyment to things. 0    I have blamed myself unnecessarily when things went wrong. 0    I have been anxious or worried for no good reason. 0    I have felt scared or panicky for no good reason. 0    Things have been getting on top of me. 1    I have been so unhappy that I have had difficulty sleeping. 0    I have felt sad or miserable. 0    I have been so unhappy that I have been crying. 0    The thought of harming myself has occurred to me. 0    Edinburgh Postnatal Depression Scale Total 1            Health Maintenance Due  Topic Date Due   Hepatitis C Screening  Never done   COVID-19 Vaccine (5 - 2023-24 season) 05/05/2022   The following portions of the patient's history were reviewed and updated as appropriate: allergies, current medications, past family history, past medical history, past social history, past surgical history, and problem list.  Review of  Systems Pertinent items noted in HPI and remainder of comprehensive ROS otherwise negative.  Objective:  BP 109/70   Pulse 79   Wt 168 lb 3.2 oz (76.3 kg)   LMP 02/02/2022   Breastfeeding No   BMI 31.78 kg/m    General:  alert, cooperative, appears stated age, and no distress   Breasts:  normal  Lungs: Normal effort, no problems with respiration noted  Heart:  regular rate and rhythm, warm and well perfused  Abdomen: Non-tender    Wound well approximated incision  GU exam:  not indicated       Assessment:  1. Postpartum care and examination - Doing very well, about to complete post-graduate program  2. Lactating mother - Stopped breastfeeding due to trouble latching  3. Anxiety - Doing well on zoloft  Plan:   Essential components of care per ACOG recommendations:  1.  Mood and well being: Patient with negative depression screening today. Reviewed local resources for support.  - Patient tobacco use? No.   - hx of drug use? No.    2. Infant care and feeding:  -Patient currently breastmilk feeding? No.  -Social determinants of health (SDOH) reviewed in EPIC. No concerns  3. Sexuality, contraception and birth spacing - Patient does not want a pregnancy in the next year.  Desired family size is 1 children.  - Reviewed reproductive life  planning. Reviewed contraceptive methods based on pt preferences and effectiveness.  Patient desired Abstinence today.   - Discussed birth spacing of 18 months  4. Sleep and fatigue -Encouraged family/partner/community support of 4 hrs of uninterrupted sleep to help with mood and fatigue  5. Physical Recovery  - Discussed patients delivery and complications. She describes her labor as mixed. - Patient had a C-section emergent.  - Patient has urinary incontinence? No. - Patient is safe to resume physical and sexual activity  6.  Health Maintenance - HM due items addressed Yes - Last pap smear  Diagnosis  Date Value Ref Range Status   04/14/2022   Final   - Negative for intraepithelial lesion or malignancy (NILM)   Pap smear not done at today's visit.  -Breast Cancer screening indicated? No.   7. Chronic Disease/Pregnancy Condition follow up: None - PCP follow up as needed  Bernerd Limbo, CNM [facility] [employer], [facility]

## 2022-12-20 ENCOUNTER — Encounter: Payer: Self-pay | Admitting: Certified Nurse Midwife

## 2022-12-20 ENCOUNTER — Other Ambulatory Visit: Payer: Self-pay

## 2022-12-20 ENCOUNTER — Ambulatory Visit (INDEPENDENT_AMBULATORY_CARE_PROVIDER_SITE_OTHER): Payer: BC Managed Care – PPO | Admitting: Certified Nurse Midwife

## 2022-12-20 DIAGNOSIS — F419 Anxiety disorder, unspecified: Secondary | ICD-10-CM

## 2023-02-01 ENCOUNTER — Telehealth: Payer: Self-pay | Admitting: Family Medicine

## 2023-02-01 NOTE — Telephone Encounter (Signed)
Patient is requesting medication for yeast infection, having pain in stomach area where patient had c-section

## 2023-02-02 ENCOUNTER — Other Ambulatory Visit (HOSPITAL_COMMUNITY)
Admission: RE | Admit: 2023-02-02 | Discharge: 2023-02-02 | Disposition: A | Discharge status: Home / Self Care | Payer: BC Managed Care – PPO | Source: Ambulatory Visit | Attending: Family Medicine | Admitting: Family Medicine

## 2023-02-02 ENCOUNTER — Other Ambulatory Visit: Payer: Self-pay

## 2023-02-02 ENCOUNTER — Ambulatory Visit (INDEPENDENT_AMBULATORY_CARE_PROVIDER_SITE_OTHER): Payer: BC Managed Care – PPO

## 2023-02-02 VITALS — BP 114/71 | HR 67

## 2023-02-02 DIAGNOSIS — N898 Other specified noninflammatory disorders of vagina: Secondary | ICD-10-CM | POA: Insufficient documentation

## 2023-02-02 DIAGNOSIS — Z30013 Encounter for initial prescription of injectable contraceptive: Secondary | ICD-10-CM

## 2023-02-02 LAB — POCT PREGNANCY, URINE: Preg Test, Ur: NEGATIVE

## 2023-02-02 MED ORDER — MEDROXYPROGESTERONE ACETATE 150 MG/ML IM SUSY
150.0000 mg | PREFILLED_SYRINGE | Freq: Once | INTRAMUSCULAR | Status: AC
Start: 2023-02-02 — End: 2023-02-02
  Administered 2023-02-02: 150 mg via INTRAMUSCULAR

## 2023-02-02 NOTE — Progress Notes (Signed)
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 [name], NP to bedside for exam. Provider states there is no evidence of HSV outbreak. 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 Swab also collected during exam due to irritation. Explained patient will be contacted with any abnormal results.  Desires to start Depo for contraception today. UPT negative. Does report unprotected intercourse during past 2 weeks. Explained we cannot be certain patient is not pregnant today. Pt would like to proceed with Depo. Recommended patient take home UPT in 2 weeks to confirm.  Injection administered without complication. Patient will return in 3 months for next injection between 04/20/23-05/04/23.   [name], RN 02/02/2023  10:15 AM

## 2023-02-05 ENCOUNTER — Other Ambulatory Visit: Payer: Self-pay | Admitting: Obstetrics and Gynecology

## 2023-02-05 DIAGNOSIS — N898 Other specified noninflammatory disorders of vagina: Secondary | ICD-10-CM

## 2023-02-05 LAB — CERVICOVAGINAL ANCILLARY ONLY
Bacterial Vaginitis (gardnerella): NEGATIVE
Candida Glabrata: NEGATIVE
Candida Vaginitis: POSITIVE — AB
Chlamydia: NEGATIVE
Comment: NEGATIVE
Comment: NEGATIVE
Comment: NEGATIVE
Comment: NEGATIVE
Comment: NEGATIVE
Comment: NORMAL
Neisseria Gonorrhea: NEGATIVE
Trichomonas: NEGATIVE

## 2023-02-05 MED ORDER — FLUCONAZOLE 150 MG PO TABS
150.0000 mg | ORAL_TABLET | Freq: Once | ORAL | 0 refills | Status: DC
Start: 2023-02-05 — End: 2023-05-17

## 2023-02-05 NOTE — Telephone Encounter (Signed)
Addressed at in-person nurse visit on 02/02/23.

## 2023-02-06 ENCOUNTER — Encounter: Payer: Self-pay | Admitting: Lactation Services

## 2023-02-06 ENCOUNTER — Telehealth: Payer: Self-pay | Admitting: Lactation Services

## 2023-02-06 NOTE — Telephone Encounter (Signed)
Received request form [facility] to refill Sertraline. Per chart review, patient was prescribed on 09/06/2022 with 11 refills. Called Pharmacy and spoke with Pharmacist who reports patient has refills on file and will get them ready.

## 2023-02-13 ENCOUNTER — Encounter: Payer: Self-pay | Admitting: Certified Nurse Midwife

## 2023-02-14 ENCOUNTER — Other Ambulatory Visit: Payer: Self-pay | Admitting: *Deleted

## 2023-02-14 DIAGNOSIS — B3731 Acute candidiasis of vulva and vagina: Secondary | ICD-10-CM

## 2023-02-14 MED ORDER — FLUCONAZOLE 150 MG PO TABS: 150.0000 mg | ORAL_TABLET | Freq: Once | ORAL | 0 refills | Status: DC

## 2023-04-10 ENCOUNTER — Other Ambulatory Visit (HOSPITAL_BASED_OUTPATIENT_CLINIC_OR_DEPARTMENT_OTHER): Payer: Self-pay

## 2023-04-10 ENCOUNTER — Other Ambulatory Visit: Payer: Self-pay

## 2023-04-10 ENCOUNTER — Emergency Department (HOSPITAL_BASED_OUTPATIENT_CLINIC_OR_DEPARTMENT_OTHER)
Admission: EM | Admit: 2023-04-10 | Discharge: 2023-04-10 | Disposition: A | Discharge status: Home / Self Care | Payer: BC Managed Care – PPO | Attending: Emergency Medicine | Admitting: Emergency Medicine

## 2023-04-10 ENCOUNTER — Encounter (HOSPITAL_BASED_OUTPATIENT_CLINIC_OR_DEPARTMENT_OTHER): Payer: Self-pay

## 2023-04-10 DIAGNOSIS — I10 Essential (primary) hypertension: Secondary | ICD-10-CM

## 2023-04-10 DIAGNOSIS — R42 Dizziness and giddiness: Secondary | ICD-10-CM | POA: Insufficient documentation

## 2023-04-10 LAB — CBC WITH DIFFERENTIAL/PLATELET
Abs Immature Granulocytes: 0.02 10*3/uL (ref 0.00–0.07)
Basophils Absolute: 0 10*3/uL (ref 0.0–0.1)
Basophils Relative: 1 %
Eosinophils Absolute: 0.1 10*3/uL (ref 0.0–0.5)
Eosinophils Relative: 2 %
HCT: 38.6 % (ref 36.0–46.0)
Hemoglobin: 12.7 g/dL (ref 12.0–15.0)
Immature Granulocytes: 0 %
Lymphocytes Relative: 32 %
Lymphs Abs: 1.9 10*3/uL (ref 0.7–4.0)
MCH: 27.3 pg (ref 26.0–34.0)
MCHC: 32.9 g/dL (ref 30.0–36.0)
MCV: 83 fL (ref 80.0–100.0)
Monocytes Absolute: 0.5 10*3/uL (ref 0.1–1.0)
Monocytes Relative: 8 %
Neutro Abs: 3.5 10*3/uL (ref 1.7–7.7)
Neutrophils Relative %: 57 %
Platelets: 306 10*3/uL (ref 150–400)
RBC: 4.65 MIL/uL (ref 3.87–5.11)
RDW: 15.1 % (ref 11.5–15.5)
WBC: 6 10*3/uL (ref 4.0–10.5)
nRBC: 0 % (ref 0.0–0.2)

## 2023-04-10 LAB — BASIC METABOLIC PANEL
Anion gap: 10 (ref 5–15)
BUN: 15 mg/dL (ref 6–20)
CO2: 23 mmol/L (ref 22–32)
Calcium: 9.5 mg/dL (ref 8.9–10.3)
Chloride: 105 mmol/L (ref 98–111)
Creatinine, Ser: 0.85 mg/dL (ref 0.44–1.00)
GFR, Estimated: >60 mL/min (ref >60)
Glucose, Bld: 88 mg/dL (ref 70–99)
Potassium: 4 mmol/L (ref 3.5–5.1)
Sodium: 138 mmol/L (ref 135–145)

## 2023-04-10 LAB — PREGNANCY, URINE: Preg Test, Ur: NEGATIVE

## 2023-04-10 MED ORDER — ACETAMINOPHEN 325 MG PO TABS
650.0000 mg | ORAL_TABLET | Freq: Once | ORAL | Status: AC
  Administered 2023-04-10: 650 mg via ORAL
  Filled 2023-04-10: qty 2

## 2023-04-10 MED ORDER — SODIUM CHLORIDE 0.9 % IV BOLUS
1000.0000 mL | Freq: Once | INTRAVENOUS | Status: AC
  Administered 2023-04-10: 1000 mL via INTRAVENOUS

## 2023-04-10 NOTE — ED Provider Notes (Signed)
Care of patient received from prior provider at 3:42 PM, please see their note for complete H/P and care plan.  Received handoff per ED course.  Clinical Course as of 04/10/23 1542  Tue Apr 10, 2023  1533 Basic metabolic panel [JS]  1540 Stable dizziness intermittent.  Labs pending  [CC]    Clinical Course User Index [CC] Glyn Ade, MD [JS] Anders Simmonds T, DO    Reassessment: Symptoms have resolved on repeat exam. Notably she does have peripheral edema mild.  She had to be seen by cardiology during her recent pregnancy for palpitations but had a reassuring echocardiogram at that time.  She states the swelling is only started since her postnatal period.  Possible developing heart failure and a recent pregnancy patient would be on the differential but currently not very likely. Will refer back to cardiology for further outpatient care management given clinical stability resolution of current symptoms.    Glyn Ade, MD 04/10/23 [phone]

## 2023-04-10 NOTE — ED Provider Notes (Signed)
[facility] EMERGENCY DEPARTMENT AT [facility] Provider Note   CSN: [id number] Arrival date & time: 04/10/23  1415     History  Chief Complaint  Patient presents with   Dizziness    Victoria Hoffman is a 22 y.o. female.  This is a 22 year old female who comes to the emergency department today due to an episode of feeling dizzy and having high blood pressure at her work.  She says that her blood pressure was 140/90.  She says that this is high for her.  She says that over the last couple of months she has noticed that she has increasing swelling in her legs and hands.  She says that this is new since pregnancy.  She had a C-section 5 months ago.   Dizziness      Home Medications Prior to Admission medications   Medication Sig Start Date End Date Taking? Authorizing Provider  fluconazole (DIFLUCAN) 150 MG tablet Take 1 tablet (150 mg total) by mouth once for 1 dose. 02/05/23 02/05/23  Sue Lush, FNP  fluconazole (DIFLUCAN) 150 MG tablet Take 1 tablet (150 mg total) by mouth once for 1 dose. 02/14/23 02/14/23  Sue Lush, FNP  gabapentin (NEURONTIN) 100 MG capsule Take 1 capsule (100 mg total) by mouth 2 (two) times daily. Patient not taking: Reported on 12/20/2022 12/05/22   Venora Maples, MD  Prenatal Vit-Fe Fumarate-FA (PRENATAL MULTIVITAMIN) TABS tablet Take 1 tablet by mouth daily. Patient not taking: Reported on 12/20/2022    [provider]  sertraline (ZOLOFT) 25 MG tablet Take 1 tablet (25 mg total) by mouth daily. Patient not taking: Reported on 12/05/2022 09/06/22   Bernerd Limbo, CNM  valACYclovir (VALTREX) 1000 MG tablet Take 1 tablet (1,000 mg total) by mouth daily. 07/06/22   Tereso Newcomer, MD      Allergies    Flagyl [metronidazole]    Review of Systems   Review of Systems  Neurological:  Positive for dizziness.    Physical Exam Updated Vital Signs BP 137/84   Pulse 89   Temp 98.6 F (37 C) (Oral)   Resp 15   SpO2 100%   Physical Exam Vitals reviewed.  Constitutional:      Appearance: Normal appearance.  HENT:     Head: Atraumatic.  Cardiovascular:     Rate and Rhythm: Normal rate.  Pulmonary:     Effort: Pulmonary effort is normal.  Neurological:     Mental Status: She is alert.     ED Results / Procedures / Treatments   Labs (all labs ordered are listed, but only abnormal results are displayed) Labs Reviewed  BASIC METABOLIC PANEL  CBC WITH DIFFERENTIAL/PLATELET  PREGNANCY, URINE    EKG None  Radiology No results found.  Procedures Procedures    Medications Ordered in ED Medications  acetaminophen (TYLENOL) tablet 650 mg (has no administration in time range)  sodium chloride 0.9 % bolus 1,000 mL (has no administration in time range)    ED Course/ Medical Decision Making/ A&P Clinical Course as of 04/10/23 1542  Tue Apr 10, 2023  1533 Basic metabolic panel [JS]  1540 Stable dizziness intermittent.  Labs pending  [CC]    Clinical Course User Index [CC] Glyn Ade, MD [JS] Arletha Pili, DO                                 Medical Decision Making 22 year old  female here today for dizziness and reported high blood pressure.  Plan -patient with a normal EKG.  Vital signs are reassuring.  Blood pressure is not elevated.  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  Will check labs for kidney function, anemia.,  Urine pregnancy.  If normal, will discharge patient with PCP follow-up.  Considered sending patient with prescription for antihypertensives, however believe patient would benefit from establishing a blood pressure log prior to initiation of medications.  Reassessment-CBC with normal hemoglobin.  Patient signed out to evening physician pending BMP.  Amount and/or Complexity of Data Reviewed Labs: ordered. Decision-making details documented in ED Course.  Risk OTC drugs.           Final Clinical Impression(s) / ED Diagnoses Final diagnoses:   None    Rx / DC Orders ED Discharge Orders     None         Arletha Pili, DO 04/10/23 1542

## 2023-04-10 NOTE — ED Triage Notes (Signed)
Pt c/o dizzy/ lightheaded spells, hands & feet swollen. States today at work, 140/94. Denies known precipitating factors. Endorses "a little weakness," denies known bleeding, blurred vision, CP, SHOB  States she had baby 5mos ago, gestational HTN

## 2023-04-10 NOTE — Discharge Instructions (Addendum)
EKG, blood work that was done here today was normal.  Your pregnancy test was negative.  I would recommend that you begin keeping a log of your blood pressure.  You should follow-up with a primary care doctor within 1 to 2 weeks.

## 2023-04-20 ENCOUNTER — Ambulatory Visit: Payer: BC Managed Care – PPO

## 2023-05-17 ENCOUNTER — Encounter: Payer: Self-pay | Admitting: Certified Nurse Midwife

## 2023-05-17 DIAGNOSIS — B379 Candidiasis, unspecified: Secondary | ICD-10-CM

## 2023-05-17 MED ORDER — FLUCONAZOLE 150 MG PO TABS
150.0000 mg | ORAL_TABLET | Freq: Once | ORAL | 0 refills | Status: DC
Start: 2023-05-17 — End: 2023-07-04

## 2023-07-02 ENCOUNTER — Encounter: Payer: Self-pay | Admitting: Certified Nurse Midwife

## 2023-07-04 ENCOUNTER — Other Ambulatory Visit: Payer: Self-pay

## 2023-07-04 ENCOUNTER — Ambulatory Visit: Payer: BC Managed Care – PPO

## 2023-07-04 ENCOUNTER — Other Ambulatory Visit (HOSPITAL_COMMUNITY)
Admission: RE | Admit: 2023-07-04 | Discharge: 2023-07-04 | Disposition: A | Discharge status: Home / Self Care | Payer: BC Managed Care – PPO | Source: Ambulatory Visit | Attending: Family Medicine | Admitting: Family Medicine

## 2023-07-04 VITALS — BP 123/76 | HR 77

## 2023-07-04 DIAGNOSIS — N898 Other specified noninflammatory disorders of vagina: Secondary | ICD-10-CM

## 2023-07-04 DIAGNOSIS — R8290 Unspecified abnormal findings in urine: Secondary | ICD-10-CM | POA: Diagnosis not present

## 2023-07-04 LAB — POCT URINALYSIS DIP (DEVICE)
Bilirubin Urine: NEGATIVE
Glucose, UA: NEGATIVE mg/dL
Nitrite: NEGATIVE
Protein, ur: NEGATIVE mg/dL
Specific Gravity, Urine: >=1.03 (ref 1.005–1.030)
Urobilinogen, UA: 0.2 mg/dL (ref 0.0–1.0)
pH: 6.5 (ref 5.0–8.0)

## 2023-07-04 MED ORDER — NITROFURANTOIN MONOHYD MACRO 100 MG PO CAPS
100.0000 mg | ORAL_CAPSULE | Freq: Two times a day (BID) | ORAL | 0 refills | Status: AC
Start: 2023-07-04 — End: ?

## 2023-07-04 NOTE — Progress Notes (Signed)
Pt here today for possible UTI.  Pt reports that she has urinary frequency with no pain or burning.  Pt also reports having a vaginal odor mostly after she has intercourse.  Pt states that she usually uses boric acid for the odor and it goes away.  Urinalysis resulted ketone and blood.  Pt informed that RN send urine for culture and that Macrobid 100 mg po bid x 7 days will be sent for early treatment.  Pt advised that if culture comes back needing a different tx then we will call her.  Pt explained how to obtain self swab.  Pt advised that we will call with abnormal results.    Yang Rack,RN  07/04/23

## 2023-07-05 LAB — CERVICOVAGINAL ANCILLARY ONLY
Bacterial Vaginitis (gardnerella): NEGATIVE
Candida Glabrata: NEGATIVE
Candida Vaginitis: NEGATIVE
Chlamydia: NEGATIVE
Comment: NEGATIVE
Comment: NEGATIVE
Comment: NEGATIVE
Comment: NEGATIVE
Comment: NEGATIVE
Comment: NORMAL
Neisseria Gonorrhea: NEGATIVE
Trichomonas: NEGATIVE

## 2023-07-06 LAB — URINE CULTURE
